# Patient Record
Sex: Female | Born: 1960 | Race: Black or African American | Hispanic: No | Marital: Single | Smoking: Current every day smoker
Health system: Southern US, Community
[De-identification: ages and names within clinical notes are randomized; demographics above are authoritative.]

## PROBLEM LIST (undated history)

## (undated) DIAGNOSIS — R112 Nausea with vomiting, unspecified: Secondary | ICD-10-CM

## (undated) DIAGNOSIS — Z9889 Other specified postprocedural states: Secondary | ICD-10-CM

## (undated) DIAGNOSIS — I2781 Cor pulmonale (chronic): Secondary | ICD-10-CM

## (undated) DIAGNOSIS — D649 Anemia, unspecified: Secondary | ICD-10-CM

## (undated) DIAGNOSIS — Z720 Tobacco use: Secondary | ICD-10-CM

## (undated) DIAGNOSIS — J449 Chronic obstructive pulmonary disease, unspecified: Secondary | ICD-10-CM

## (undated) HISTORY — DX: Anemia, unspecified: D64.9

## (undated) HISTORY — PX: OTHER SURGICAL HISTORY: SHX169

---

## 2001-06-28 ENCOUNTER — Encounter: Payer: Self-pay | Admitting: Emergency Medicine

## 2001-06-28 ENCOUNTER — Emergency Department (HOSPITAL_COMMUNITY): Admission: EM | Admit: 2001-06-28 | Discharge: 2001-06-28 | Payer: Self-pay | Admitting: Emergency Medicine

## 2002-01-07 ENCOUNTER — Emergency Department (HOSPITAL_COMMUNITY): Admission: EM | Admit: 2002-01-07 | Discharge: 2002-01-07 | Payer: Self-pay | Admitting: Emergency Medicine

## 2002-01-12 ENCOUNTER — Emergency Department (HOSPITAL_COMMUNITY): Admission: EM | Admit: 2002-01-12 | Discharge: 2002-01-12 | Payer: Self-pay | Admitting: Emergency Medicine

## 2002-01-12 ENCOUNTER — Encounter: Payer: Self-pay | Admitting: Emergency Medicine

## 2002-01-14 ENCOUNTER — Encounter: Admission: RE | Admit: 2002-01-14 | Discharge: 2002-01-22 | Payer: Self-pay | Admitting: *Deleted

## 2002-01-23 ENCOUNTER — Encounter: Admission: RE | Admit: 2002-01-23 | Discharge: 2002-02-13 | Payer: Self-pay | Admitting: *Deleted

## 2004-07-11 ENCOUNTER — Emergency Department (HOSPITAL_COMMUNITY): Admission: EM | Admit: 2004-07-11 | Discharge: 2004-07-11 | Payer: Self-pay | Admitting: Emergency Medicine

## 2004-11-29 ENCOUNTER — Emergency Department (HOSPITAL_COMMUNITY): Admission: EM | Admit: 2004-11-29 | Discharge: 2004-11-29 | Payer: Self-pay | Admitting: Emergency Medicine

## 2004-12-26 ENCOUNTER — Ambulatory Visit: Payer: Self-pay | Admitting: *Deleted

## 2010-03-28 ENCOUNTER — Emergency Department (HOSPITAL_COMMUNITY): Payer: Self-pay

## 2010-03-28 ENCOUNTER — Emergency Department (HOSPITAL_COMMUNITY)
Admission: EM | Admit: 2010-03-28 | Discharge: 2010-03-28 | Disposition: A | Discharge status: Home / Self Care | Payer: Self-pay | Attending: Emergency Medicine | Admitting: Emergency Medicine

## 2010-03-28 DIAGNOSIS — IMO0002 Reserved for concepts with insufficient information to code with codable children: Secondary | ICD-10-CM | POA: Insufficient documentation

## 2010-03-28 DIAGNOSIS — W010XXA Fall on same level from slipping, tripping and stumbling without subsequent striking against object, initial encounter: Secondary | ICD-10-CM | POA: Insufficient documentation

## 2010-03-28 DIAGNOSIS — M25519 Pain in unspecified shoulder: Secondary | ICD-10-CM | POA: Insufficient documentation

## 2010-03-28 DIAGNOSIS — Y92009 Unspecified place in unspecified non-institutional (private) residence as the place of occurrence of the external cause: Secondary | ICD-10-CM | POA: Insufficient documentation

## 2010-03-28 DIAGNOSIS — M79609 Pain in unspecified limb: Secondary | ICD-10-CM | POA: Insufficient documentation

## 2016-04-24 ENCOUNTER — Emergency Department (HOSPITAL_COMMUNITY)
Admission: EM | Admit: 2016-04-24 | Discharge: 2016-04-24 | Disposition: A | Discharge status: Home / Self Care | Payer: Self-pay | Attending: Emergency Medicine | Admitting: Emergency Medicine

## 2016-04-24 ENCOUNTER — Emergency Department (HOSPITAL_COMMUNITY): Payer: Self-pay

## 2016-04-24 ENCOUNTER — Encounter (HOSPITAL_COMMUNITY): Payer: Self-pay | Admitting: Emergency Medicine

## 2016-04-24 DIAGNOSIS — Y999 Unspecified external cause status: Secondary | ICD-10-CM | POA: Insufficient documentation

## 2016-04-24 DIAGNOSIS — F17210 Nicotine dependence, cigarettes, uncomplicated: Secondary | ICD-10-CM | POA: Insufficient documentation

## 2016-04-24 DIAGNOSIS — Y9389 Activity, other specified: Secondary | ICD-10-CM | POA: Insufficient documentation

## 2016-04-24 DIAGNOSIS — S82851A Displaced trimalleolar fracture of right lower leg, initial encounter for closed fracture: Secondary | ICD-10-CM | POA: Insufficient documentation

## 2016-04-24 DIAGNOSIS — W000XXA Fall on same level due to ice and snow, initial encounter: Secondary | ICD-10-CM | POA: Insufficient documentation

## 2016-04-24 DIAGNOSIS — Y929 Unspecified place or not applicable: Secondary | ICD-10-CM | POA: Insufficient documentation

## 2016-04-24 MED ORDER — PROPOFOL 10 MG/ML IV BOLUS
0.5000 mg/kg | INTRAVENOUS | Status: DC | PRN
  Administered 2016-04-24: 59 mg via INTRAVENOUS
  Filled 2016-04-24: qty 20

## 2016-04-24 MED ORDER — HYDROMORPHONE HCL 1 MG/ML IJ SOLN
1.0000 mg | Freq: Once | INTRAMUSCULAR | Status: AC
  Administered 2016-04-24: 1 mg via INTRAVENOUS
  Filled 2016-04-24: qty 1

## 2016-04-24 MED ORDER — HYDROCODONE-ACETAMINOPHEN 5-325 MG PO TABS
2.0000 | ORAL_TABLET | Freq: Once | ORAL | Status: AC
  Administered 2016-04-24: 2 via ORAL
  Filled 2016-04-24: qty 2

## 2016-04-24 MED ORDER — HYDROCODONE-ACETAMINOPHEN 5-325 MG PO TABS: 1.0000 | ORAL_TABLET | Freq: Four times a day (QID) | ORAL | 0 refills | Status: DC | PRN

## 2016-04-24 NOTE — ED Provider Notes (Signed)
[facility]-EMERGENCY DEPT Provider Note   CSN: [id number] Arrival date & time: 04/24/16  [id number]     History   Chief Complaint Chief Complaint  Patient presents with  . Fall    HPI Stephanie Knight is a 56 y.o. female.  Patient presents to the ED with a chief complaint of right ankle pain.  She states that she slipped and fell on the ice this morning.  She has been unable to ambulate since.  She complains of severe pain.  The pain is worsened with movement and palpation.  She denies any other injuries.  Denies any LOC.  She received 100 mcg Fentanyl from EMS.   The history is provided by the patient. No language interpreter was used.    History reviewed. No pertinent past medical history.  There are no active problems to display for this patient.   History reviewed. No pertinent surgical history.  OB History    No data available       Home Medications    Prior to Admission medications   Not on File    Family History History reviewed. No pertinent family history.  Social History Social History  Substance Use Topics  . Smoking status: Current Every Day Smoker    Packs/day: 0.50    Types: Cigarettes  . Smokeless tobacco: Never Used  . Alcohol use No     Allergies   Patient has no allergy information on record.   Review of Systems Review of Systems  All other systems reviewed and are negative.    Physical Exam Updated Vital Signs BP 129/64 (BP Location: Right Arm)   Pulse 64   Temp 98 F (36.7 C) (Oral)   Resp 18   Ht 5\' 7"  (1.702 m)   Wt 117.9 kg   SpO2 96%   BMI 40.72 kg/m   Physical Exam Nursing note and vitals reviewed.  Constitutional: Pt appears well-developed and well-nourished. No distress.  HENT:  Head: Normocephalic and atraumatic.  Eyes: Conjunctivae are normal.  Neck: Normal range of motion.  Cardiovascular: Normal rate, regular rhythm. Intact distal pulses.   Capillary refill < 3 sec.  Pulmonary/Chest: Effort normal and breath  sounds normal.  Musculoskeletal:  RLE Pt exhibits obvious deformity to right ankle with tenderness to palpation diffusely, appears dislocated vs fracture dislocation.   ROM: deferred due to pain  Strength: deferred due to pain  Neurological: Pt  is alert. Coordination normal.  Sensation: 5/5 Skin: Skin is warm and dry. Pt is not diaphoretic.  No evidence of open wound or skin tenting Psychiatric: Pt has a normal mood and affect.     ED Treatments / Results  Labs (all labs ordered are listed, but only abnormal results are displayed) Labs Reviewed - No data to display  EKG  EKG Interpretation None       Radiology Dg Ankle Complete Right  Result Date: 04/24/2016 CLINICAL DATA:  Postreduction or ankle dislocation EXAM: RIGHT ANKLE - COMPLETE 3+ VIEW COMPARISON:  Preoperative study obtained earlier in the day FINDINGS: Frontal and lateral views were obtained. Overlying immobilization device. The previous ankle mortise disruption has been reduced. Comminuted fracture of the distal fibular diaphysis remains with several mildly displaced fragments. Comminuted fracture of the medial malleolus present with fracture fragments in near anatomic alignment. Fracture of the distal tibia noted with fracture fragments in near anatomic alignment. IMPRESSION: Reduction of mortise dislocation with trimalleolar fractures present. Fracture fragments overall in near anatomic alignment. Electronically Signed   By: Chrissie NoaWilliam  Margarita Grizzle III M.D.   On: 04/24/2016 11:02   Dg Ankle Complete Right  Result Date: 04/24/2016 CLINICAL DATA:  Right ankle injury and pain due to a slip on the ice this morning. Initial encounter. EXAM: RIGHT ANKLE - COMPLETE 3+ VIEW COMPARISON:  None. FINDINGS: The tibiotalar joint is posteriorly dislocated. Oblique fracture of the distal fibula demonstrates 1 shaft width posterior displacement of the distal fragment. Small fracture of the posterior malleolus is distracted 2 cm from the  remainder of the tibia. Minimally displaced medial malleolar fracture is also seen. Soft tissue swelling is present about the ankle. IMPRESSION: Trimalleolar fracture dislocation right ankle as described. Electronically Signed   By: Drusilla Kanner M.D.   On: 04/24/2016 10:04    Procedures Reduction of dislocation Date/Time: 04/24/2016 2:19 PM Performed by: Roxy Horseman Authorized by: Roxy Horseman  Consent: Verbal consent obtained. Risks and benefits: risks, benefits and alternatives were discussed Consent given by: patient Patient understanding: patient states understanding of the procedure being performed Patient consent: the patient's understanding of the procedure matches consent given Procedure consent: procedure consent matches procedure scheduled Relevant documents: relevant documents present and verified Test results: test results available and properly labeled Site marked: the operative site was marked Imaging studies: imaging studies available Required items: required blood products, implants, devices, and special equipment available Patient identity confirmed: verbally with patient Time out: Immediately prior to procedure a "time out" was called to verify the correct patient, procedure, equipment, support staff and site/side marked as required. Local anesthesia used: no  Anesthesia: Local anesthesia used: no  Sedation: Patient sedated: yes (See Dr. Delford Field note) Patient tolerance: Patient tolerated the procedure well with no immediate complications    (including critical care time)  SPLINT APPLICATION Date/Time: 2:21 PM Authorized by: Roxy Horseman Consent: Verbal consent obtained. Risks and benefits: risks, benefits and alternatives were discussed Consent given by: patient Splint applied by: orthopedic technician Location details: right ankle Splint type: Posterior and strirrup Supplies used: fiberglass and ace Post-procedure: The splinted body part  was neurovascularly unchanged following the procedure. Patient tolerance: Patient tolerated the procedure well with no immediate complications.    Medications Ordered in ED Medications  HYDROmorphone (DILAUDID) injection 1 mg (not administered)     Initial Impression / Assessment and Plan / ED Course  I have reviewed the triage vital signs and the nursing notes.  Pertinent labs & imaging results that were available during my care of the patient were reviewed by me and considered in my medical decision making (see chart for details).     Patient with trimalleolar fracture as above.  Reduced in ED.  Discussed with Dr. Eulah Pont, who recommends follow-up in his office tomorrow at 8:30 and to get a CT here.  Pt advised to follow up with orthopedics. Patient given splint and crutches while in ED, conservative therapy recommended and discussed. Patient will be discharged home & is agreeable with above plan. Returns precautions discussed. Pt appears safe for discharge.   Final Clinical Impressions(s) / ED Diagnoses   Final diagnoses:  Closed trimalleolar fracture of right ankle, initial encounter    New Prescriptions New Prescriptions   HYDROCODONE-ACETAMINOPHEN (NORCO/VICODIN) 5-325 MG TABLET    Take 1-2 tablets by mouth every 6 (six) hours as needed.     Roxy Horseman, PA-C 04/24/16 1422    Linwood Dibbles, MD 04/26/16 1504    Linwood Dibbles, MD 04/26/16 [phone]

## 2016-04-24 NOTE — ED Triage Notes (Signed)
Per EMS patient fell outside on her steps this morning.  No LOC, obvious deformity to right ankle.  Patient received 100 fentanyl via EMS en route to hospital.

## 2016-04-24 NOTE — ED Notes (Signed)
Patient transported to CT 

## 2016-04-24 NOTE — ED Notes (Signed)
Bed: [id number] Expected date:  Expected time:  Means of arrival:  Comments: EMS-FX ankle

## 2016-04-24 NOTE — ED Notes (Signed)
Called Ortho Tech to inform of crutches order.

## 2016-04-24 NOTE — ED Provider Notes (Signed)
Medical screening examination/treatment/procedure(s) were conducted as a shared visit with non-physician practitioner(s) and myself.  I personally evaluated the patient during the encounter.  .Sedation Date/Time: 04/24/2016 9:00 AM Performed by: Linwood DibblesKNAPP, Deshea Pooley Authorized by: Linwood DibblesKNAPP, Caedon Bond   Consent:    Consent obtained:  Verbal   Consent given by:  Patient   Risks discussed:  Allergic reaction, dysrhythmia, inadequate sedation, nausea, vomiting, respiratory compromise necessitating ventilatory assistance and intubation, prolonged sedation necessitating reversal and prolonged hypoxia resulting in organ damage   Alternatives discussed:  Analgesia without sedation Indications:    Procedure performed:  Dislocation reduction   Procedure necessitating sedation performed by:  Physician performing sedation   Intended level of sedation:  Deep Pre-sedation assessment:    Time since last food or drink:  12 hours   ASA classification: class 1 - normal, healthy patient     Neck mobility: normal     Mouth opening:  3 or more finger widths   Thyromental distance:  3 finger widths   Mallampati score:  II - soft palate, uvula, fauces visible   Pre-sedation assessments completed and reviewed: airway patency, cardiovascular function, hydration status, mental status, nausea/vomiting, pain level, respiratory function and temperature     History of difficult intubation: no     Pre-sedation assessment completed:  04/24/2016 9:02 AM Immediate pre-procedure details:    Reassessment: Patient reassessed immediately prior to procedure     Reviewed: vital signs and NPO status     Verified: bag valve mask available, emergency equipment available, intubation equipment available, IV patency confirmed, oxygen available, reversal medications available and suction available   Procedure details (see MAR for exact dosages):    Sedation start time:  04/24/2016 10:34 AM   Preoxygenation:  Nasal cannula   Sedation:  Propofol  Intra-procedure monitoring:  Blood pressure monitoring, cardiac monitor, continuous pulse oximetry, frequent LOC assessments, frequent vital sign checks and continuous capnometry   Intra-procedure events: none     Sedation end time:  04/24/2016 10:49 AM Post-procedure details:    Post-sedation assessment completed:  04/24/2016 10:49 AM   Attendance: Constant attendance by certified staff until patient recovered     Recovery: Patient returned to pre-procedure baseline     Estimated blood loss (see I/O flowsheets): no     Post-sedation assessments completed and reviewed: airway patency, cardiovascular function, hydration status, mental status, nausea/vomiting, pain level and temperature     Specimens recovered:  None   Patient tolerance:  Tolerated well, no immediate complications       Linwood DibblesJon Shavana Calder, MD 04/24/16 1049

## 2016-04-27 ENCOUNTER — Encounter (HOSPITAL_BASED_OUTPATIENT_CLINIC_OR_DEPARTMENT_OTHER): Payer: Self-pay | Admitting: *Deleted

## 2016-04-27 NOTE — H&P (Signed)
  [facility] [address]   [address] [place], [place] [id number] [phone] A Division of [facility]  RE: Stephanie JaegerMCNAIR, Stephanie   [phone]   02-23-2060  04-25-16 Reason for visit: Referral from the emergency room with a right ankle fracture dislocation that was reduced there on April 24, 2016.  She slipped on the ice.   History of present illness: Her pain is well controlled.    EXAMINATION: Well appearing female in no apparent distress.  Splint is benign.  She still has a fair amount of swelling.  She is neurovascularly intact.    X-RAYS: X-rays from the emergency room confirm appropriate reduction of this fracture.  I did obtain a CT, it is really more of a pilon pattern with significant joint space involvement.    ASSESSMENT/PLAN: Trimalleolar mechanism with a pilon type fracture.  I discussed the risks and benefits of ORIF of this.  She would like to go forward with that.  She will follow up afterwards.  She will be non-weight bearing for six weeks.    Jewel Baizeimothy D.  Eulah PontMurphy, M.D.  Electronically verified by Jewel Baizeimothy D. Eulah PontMurphy, M.D. TDM:jjh D 04-26-16 T 04-27-16

## 2016-05-02 NOTE — Progress Notes (Signed)
Spoke with Aquilla HackerHenry Martensen III PA on Friday and did not want MRSA screening- said it was in his order set.

## 2016-05-03 ENCOUNTER — Ambulatory Visit (HOSPITAL_BASED_OUTPATIENT_CLINIC_OR_DEPARTMENT_OTHER): Payer: Self-pay | Admitting: Anesthesiology

## 2016-05-03 ENCOUNTER — Encounter (HOSPITAL_BASED_OUTPATIENT_CLINIC_OR_DEPARTMENT_OTHER)
Admission: RE | Disposition: A | Discharge status: Home / Self Care | Payer: Self-pay | Source: Ambulatory Visit | Attending: Orthopedic Surgery

## 2016-05-03 ENCOUNTER — Ambulatory Visit (HOSPITAL_BASED_OUTPATIENT_CLINIC_OR_DEPARTMENT_OTHER)
Admission: RE | Admit: 2016-05-03 | Discharge: 2016-05-03 | Disposition: A | Discharge status: Home / Self Care | Payer: Self-pay | Source: Ambulatory Visit | Attending: Orthopedic Surgery | Admitting: Orthopedic Surgery

## 2016-05-03 ENCOUNTER — Encounter (HOSPITAL_BASED_OUTPATIENT_CLINIC_OR_DEPARTMENT_OTHER): Payer: Self-pay | Admitting: Anesthesiology

## 2016-05-03 DIAGNOSIS — F17200 Nicotine dependence, unspecified, uncomplicated: Secondary | ICD-10-CM | POA: Insufficient documentation

## 2016-05-03 DIAGNOSIS — W000XXA Fall on same level due to ice and snow, initial encounter: Secondary | ICD-10-CM | POA: Insufficient documentation

## 2016-05-03 DIAGNOSIS — Z6841 Body Mass Index (BMI) 40.0 and over, adult: Secondary | ICD-10-CM | POA: Insufficient documentation

## 2016-05-03 DIAGNOSIS — S82871A Displaced pilon fracture of right tibia, initial encounter for closed fracture: Secondary | ICD-10-CM | POA: Insufficient documentation

## 2016-05-03 DIAGNOSIS — S82891A Other fracture of right lower leg, initial encounter for closed fracture: Secondary | ICD-10-CM

## 2016-05-03 HISTORY — PX: ORIF ANKLE FRACTURE: SHX5408

## 2016-05-03 HISTORY — DX: Other specified postprocedural states: R11.2

## 2016-05-03 HISTORY — DX: Other specified postprocedural states: Z98.890

## 2016-05-03 HISTORY — DX: Nausea with vomiting, unspecified: R11.2

## 2016-05-03 SURGERY — OPEN REDUCTION INTERNAL FIXATION (ORIF) ANKLE FRACTURE
Anesthesia: Regional | Laterality: Right

## 2016-05-03 MED ORDER — LIDOCAINE 2% (20 MG/ML) 5 ML SYRINGE
INTRAMUSCULAR | Status: AC
  Filled 2016-05-03: qty 5

## 2016-05-03 MED ORDER — SCOPOLAMINE 1 MG/3DAYS TD PT72
MEDICATED_PATCH | TRANSDERMAL | Status: AC
  Filled 2016-05-03: qty 1

## 2016-05-03 MED ORDER — PROPOFOL 10 MG/ML IV BOLUS
INTRAVENOUS | Status: AC
  Filled 2016-05-03: qty 20

## 2016-05-03 MED ORDER — LACTATED RINGERS IV SOLN
INTRAVENOUS | Status: DC
  Administered 2016-05-03: 09:00:00 via INTRAVENOUS

## 2016-05-03 MED ORDER — ONDANSETRON HCL 4 MG/2ML IJ SOLN
INTRAMUSCULAR | Status: AC
  Filled 2016-05-03: qty 2

## 2016-05-03 MED ORDER — SCOPOLAMINE 1 MG/3DAYS TD PT72: 1.0000 | MEDICATED_PATCH | Freq: Once | TRANSDERMAL | Status: DC | PRN

## 2016-05-03 MED ORDER — PHENYLEPHRINE 40 MCG/ML (10ML) SYRINGE FOR IV PUSH (FOR BLOOD PRESSURE SUPPORT)
PREFILLED_SYRINGE | INTRAVENOUS | Status: AC
  Filled 2016-05-03: qty 10

## 2016-05-03 MED ORDER — ASPIRIN EC 81 MG PO TBEC: 81.0000 mg | DELAYED_RELEASE_TABLET | Freq: Every day | ORAL | 0 refills | Status: DC

## 2016-05-03 MED ORDER — ACETAMINOPHEN 500 MG PO TABS
1000.0000 mg | ORAL_TABLET | Freq: Once | ORAL | Status: AC
  Administered 2016-05-03: 1000 mg via ORAL

## 2016-05-03 MED ORDER — ACETAMINOPHEN 500 MG PO TABS
ORAL_TABLET | ORAL | Status: AC
  Filled 2016-05-03: qty 2

## 2016-05-03 MED ORDER — LACTATED RINGERS IV SOLN: INTRAVENOUS | Status: DC

## 2016-05-03 MED ORDER — METHOCARBAMOL 500 MG PO TABS: 500.0000 mg | ORAL_TABLET | Freq: Four times a day (QID) | ORAL | 0 refills | Status: DC | PRN

## 2016-05-03 MED ORDER — PROPOFOL 10 MG/ML IV BOLUS
INTRAVENOUS | Status: DC | PRN
  Administered 2016-05-03: 200 mg via INTRAVENOUS

## 2016-05-03 MED ORDER — CEFAZOLIN SODIUM-DEXTROSE 2-4 GM/100ML-% IV SOLN
INTRAVENOUS | Status: AC
  Filled 2016-05-03: qty 100

## 2016-05-03 MED ORDER — ONDANSETRON HCL 4 MG PO TABS: 4.0000 mg | ORAL_TABLET | Freq: Three times a day (TID) | ORAL | 0 refills | Status: DC | PRN

## 2016-05-03 MED ORDER — PROMETHAZINE HCL 25 MG/ML IJ SOLN: 6.2500 mg | INTRAMUSCULAR | Status: DC | PRN

## 2016-05-03 MED ORDER — CHLORHEXIDINE GLUCONATE 4 % EX LIQD: 60.0000 mL | Freq: Once | CUTANEOUS | Status: DC

## 2016-05-03 MED ORDER — HYDROMORPHONE HCL 1 MG/ML IJ SOLN: 0.2500 mg | INTRAMUSCULAR | Status: DC | PRN

## 2016-05-03 MED ORDER — OXYCODONE HCL 5 MG PO TABS: 5.0000 mg | ORAL_TABLET | Freq: Once | ORAL | Status: DC | PRN

## 2016-05-03 MED ORDER — MEPERIDINE HCL 25 MG/ML IJ SOLN: 6.2500 mg | INTRAMUSCULAR | Status: DC | PRN

## 2016-05-03 MED ORDER — FENTANYL CITRATE (PF) 100 MCG/2ML IJ SOLN
INTRAMUSCULAR | Status: AC
  Filled 2016-05-03: qty 2

## 2016-05-03 MED ORDER — OXYCODONE HCL 5 MG/5ML PO SOLN: 5.0000 mg | Freq: Once | ORAL | Status: DC | PRN

## 2016-05-03 MED ORDER — SCOPOLAMINE 1 MG/3DAYS TD PT72
1.0000 | MEDICATED_PATCH | Freq: Once | TRANSDERMAL | Status: DC
  Administered 2016-05-03: 1.5 mg via TRANSDERMAL

## 2016-05-03 MED ORDER — DOCUSATE SODIUM 100 MG PO CAPS: 100.0000 mg | ORAL_CAPSULE | Freq: Two times a day (BID) | ORAL | 0 refills | Status: DC

## 2016-05-03 MED ORDER — FENTANYL CITRATE (PF) 100 MCG/2ML IJ SOLN
50.0000 ug | INTRAMUSCULAR | Status: DC | PRN
  Administered 2016-05-03: 100 ug via INTRAVENOUS

## 2016-05-03 MED ORDER — MIDAZOLAM HCL 2 MG/2ML IJ SOLN
1.0000 mg | INTRAMUSCULAR | Status: DC | PRN
  Administered 2016-05-03: 2 mg via INTRAVENOUS

## 2016-05-03 MED ORDER — OXYCODONE-ACETAMINOPHEN 5-325 MG PO TABS: 1.0000 | ORAL_TABLET | ORAL | 0 refills | Status: DC | PRN

## 2016-05-03 MED ORDER — DEXAMETHASONE SODIUM PHOSPHATE 10 MG/ML IJ SOLN
INTRAMUSCULAR | Status: AC
  Filled 2016-05-03: qty 1

## 2016-05-03 MED ORDER — ONDANSETRON HCL 4 MG/2ML IJ SOLN
INTRAMUSCULAR | Status: DC | PRN
  Administered 2016-05-03: 4 mg via INTRAVENOUS

## 2016-05-03 MED ORDER — MIDAZOLAM HCL 2 MG/2ML IJ SOLN
INTRAMUSCULAR | Status: AC
  Filled 2016-05-03: qty 2

## 2016-05-03 MED ORDER — CEFAZOLIN SODIUM-DEXTROSE 2-4 GM/100ML-% IV SOLN
2.0000 g | INTRAVENOUS | Status: AC
  Administered 2016-05-03: 2 g via INTRAVENOUS

## 2016-05-03 MED ORDER — CEFAZOLIN IN D5W 1 GM/50ML IV SOLN
INTRAVENOUS | Status: AC
  Filled 2016-05-03: qty 50

## 2016-05-03 MED ORDER — LIDOCAINE 2% (20 MG/ML) 5 ML SYRINGE
INTRAMUSCULAR | Status: DC | PRN
  Administered 2016-05-03: 80 mg via INTRAVENOUS

## 2016-05-03 SURGICAL SUPPLY — 78 items
BANDAGE ACE 4X5 VEL STRL LF (GAUZE/BANDAGES/DRESSINGS) ×3 IMPLANT
BANDAGE ACE 6X5 VEL STRL LF (GAUZE/BANDAGES/DRESSINGS) ×3 IMPLANT
BANDAGE ESMARK 6X9 LF (GAUZE/BANDAGES/DRESSINGS) ×1 IMPLANT
BIT DRILL 2.5X125 (BIT) ×2 IMPLANT
BIT DRILL 3.5X125 (BIT) IMPLANT
BIT DRILL CANN 2.7 (BIT) ×2
BIT DRILL CANN 2.7MM (BIT) ×1
BIT DRILL SRG 2.7XCANN AO CPLG (BIT) IMPLANT
BIT DRL SRG 2.7XCANN AO CPLNG (BIT) ×1
BLADE SURG 15 STRL LF DISP TIS (BLADE) ×2 IMPLANT
BLADE SURG 15 STRL SS (BLADE) ×6
BNDG CMPR 9X6 STRL LF SNTH (GAUZE/BANDAGES/DRESSINGS) ×1
BNDG COHESIVE 4X5 TAN STRL (GAUZE/BANDAGES/DRESSINGS) ×3 IMPLANT
BNDG ESMARK 6X9 LF (GAUZE/BANDAGES/DRESSINGS) ×3
CHLORAPREP W/TINT 26ML (MISCELLANEOUS) ×3 IMPLANT
CLOSURE STERI-STRIP 1/2X4 (GAUZE/BANDAGES/DRESSINGS) ×1
CLSR STERI-STRIP ANTIMIC 1/2X4 (GAUZE/BANDAGES/DRESSINGS) ×2 IMPLANT
COVER BACK TABLE 60X90IN (DRAPES) ×3 IMPLANT
CUFF TOURNIQUET SINGLE 24IN (TOURNIQUET CUFF) IMPLANT
CUFF TOURNIQUET SINGLE 34IN LL (TOURNIQUET CUFF) ×2 IMPLANT
DECANTER SPIKE VIAL GLASS SM (MISCELLANEOUS) IMPLANT
DRAPE EXTREMITY T 121X128X90 (DRAPE) ×3 IMPLANT
DRAPE IMP U-DRAPE 54X76 (DRAPES) ×3 IMPLANT
DRAPE OEC MINIVIEW 54X84 (DRAPES) ×3 IMPLANT
DRAPE U-SHAPE 47X51 STRL (DRAPES) ×3 IMPLANT
DRILL BIT 3.5X125 (BIT) ×3
DRSG EMULSION OIL 3X3 NADH (GAUZE/BANDAGES/DRESSINGS) ×3 IMPLANT
DRSG PAD ABDOMINAL 8X10 ST (GAUZE/BANDAGES/DRESSINGS) ×3 IMPLANT
ELECT REM PT RETURN 9FT ADLT (ELECTROSURGICAL) ×3
ELECTRODE REM PT RTRN 9FT ADLT (ELECTROSURGICAL) ×1 IMPLANT
GAUZE SPONGE 4X4 12PLY STRL (GAUZE/BANDAGES/DRESSINGS) ×3 IMPLANT
GLOVE BIO SURGEON STRL SZ7.5 (GLOVE) ×6 IMPLANT
GLOVE BIOGEL PI IND STRL 8 (GLOVE) ×2 IMPLANT
GLOVE BIOGEL PI INDICATOR 8 (GLOVE) ×4
GOWN STRL REUS W/ TWL LRG LVL3 (GOWN DISPOSABLE) ×2 IMPLANT
GOWN STRL REUS W/ TWL XL LVL3 (GOWN DISPOSABLE) ×1 IMPLANT
GOWN STRL REUS W/TWL LRG LVL3 (GOWN DISPOSABLE) ×6
GOWN STRL REUS W/TWL XL LVL3 (GOWN DISPOSABLE) ×3
K-WIRE ORTHOPEDIC 1.4X150L (WIRE) ×6
KWIRE ORTHOPEDIC 1.4X150L (WIRE) IMPLANT
NEEDLE HYPO 22GX1.5 SAFETY (NEEDLE) IMPLANT
NS IRRIG 1000ML POUR BTL (IV SOLUTION) ×3 IMPLANT
PACK BASIN DAY SURGERY FS (CUSTOM PROCEDURE TRAY) ×3 IMPLANT
PAD CAST 4YDX4 CTTN HI CHSV (CAST SUPPLIES) ×1 IMPLANT
PADDING CAST ABS 4INX4YD NS (CAST SUPPLIES) ×4
PADDING CAST ABS COTTON 4X4 ST (CAST SUPPLIES) ×2 IMPLANT
PADDING CAST COTTON 4X4 STRL (CAST SUPPLIES) ×3
PADDING CAST COTTON 6X4 STRL (CAST SUPPLIES) ×3 IMPLANT
PENCIL BUTTON HOLSTER BLD 10FT (ELECTRODE) ×3 IMPLANT
PLATE 1/3 TUBULAR 7H (Plate) ×2 IMPLANT
SCREW CANC 2.5XFT HEX12X4X (Screw) IMPLANT
SCREW CANCELLOUS 4.0X12MM (Screw) ×9 IMPLANT
SCREW CANN 4.0X42MM (Screw) ×2 IMPLANT
SCREW CANN 44X15X4XSLF DRL (Screw) IMPLANT
SCREW CANNULATED 4.0X44MM (Screw) ×3 IMPLANT
SCREW CORTEX ST MATTA 3.5X12MM (Screw) ×6 IMPLANT
SCREW CORTEX ST MATTA 3.5X20 (Screw) ×2 IMPLANT
SLEEVE SCD COMPRESS KNEE MED (MISCELLANEOUS) ×2 IMPLANT
SPLINT FAST PLASTER 5X30 (CAST SUPPLIES) ×40
SPLINT PLASTER CAST FAST 5X30 (CAST SUPPLIES) ×20 IMPLANT
SPONGE LAP 4X18 X RAY DECT (DISPOSABLE) ×3 IMPLANT
SUCTION FRAZIER HANDLE 10FR (MISCELLANEOUS) ×2
SUCTION TUBE FRAZIER 10FR DISP (MISCELLANEOUS) ×1 IMPLANT
SUT ETHILON 3 0 PS 1 (SUTURE) ×4 IMPLANT
SUT MNCRL AB 4-0 PS2 18 (SUTURE) IMPLANT
SUT MON AB 2-0 CT1 36 (SUTURE) IMPLANT
SUT MON AB 3-0 SH 27 (SUTURE)
SUT MON AB 3-0 SH27 (SUTURE) IMPLANT
SUT VIC AB 0 SH 27 (SUTURE) ×3 IMPLANT
SUT VIC AB 2-0 SH 27 (SUTURE)
SUT VIC AB 2-0 SH 27XBRD (SUTURE) IMPLANT
SYR BULB 3OZ (MISCELLANEOUS) ×3 IMPLANT
SYR CONTROL 10ML LL (SYRINGE) IMPLANT
TOWEL OR 17X24 6PK STRL BLUE (TOWEL DISPOSABLE) ×6 IMPLANT
TOWEL OR NON WOVEN STRL DISP B (DISPOSABLE) ×3 IMPLANT
TUBE CONNECTING 20'X1/4 (TUBING) ×1
TUBE CONNECTING 20X1/4 (TUBING) ×2 IMPLANT
UNDERPAD 30X30 (UNDERPADS AND DIAPERS) ×3 IMPLANT

## 2016-05-03 NOTE — Transfer of Care (Signed)
Immediate Anesthesia Transfer of Care Note  Patient: Stephanie Knight  Procedure(s) Performed: Procedure(s): OPEN REDUCTION INTERNAL FIXATION RIGHT (ORIF) ANKLE FRACTURE WEIGHT BEARING DISTAL TIBIA WITH FIXATION TIBIA AND FIBULA (Right)  Patient Location: PACU  Anesthesia Type:General  Level of Consciousness: awake  Airway & Oxygen Therapy: Patient Spontanous Breathing and Patient connected to face mask oxygen  Post-op Assessment: Report given to RN and Post -op Vital signs reviewed and stable  Post vital signs: Reviewed and stable  Last Vitals:  Vitals:   05/03/16 0930 05/03/16 1101  BP:  122/87  Pulse: (!) 55 69  Resp: 13 16  Temp:      Last Pain:  Vitals:   05/03/16 0846  TempSrc: Oral  PainSc: 0-No pain         Complications: No apparent anesthesia complications

## 2016-05-03 NOTE — Anesthesia Procedure Notes (Signed)
Anesthesia Regional Block: Popliteal block   Pre-Anesthetic Checklist: ,, timeout performed, Correct Patient, Correct Site, Correct Laterality, Correct Procedure, Correct Position, site marked, Risks and benefits discussed,  Surgical consent,  Pre-op evaluation,  At surgeon's request and post-op pain management  Laterality: Right  Prep: chloraprep       Needles:  Injection technique: Single-shot  Needle Type: Stimiplex     Needle Length: 9cm  Needle Gauge: 21     Additional Needles:   Procedures: ultrasound guided,,,,,,,,  Narrative:  Start time: 05/03/2016 9:00 AM End time: 05/03/2016 9:05 AM Injection made incrementally with aspirations every 5 mL.  Performed by: Personally  Anesthesiologist: Anitra LauthMILLER, WARREN RAY  Additional Notes: 30 ml 0.5% Ropivacaine

## 2016-05-03 NOTE — Anesthesia Procedure Notes (Signed)
Procedure Name: LMA Insertion Date/Time: 05/03/2016 9:37 AM Performed by: Caren MacadamARTER, Ranita Stjulien W Pre-anesthesia Checklist: Patient identified, Emergency Drugs available, Suction available and Patient being monitored Patient Re-evaluated:Patient Re-evaluated prior to inductionOxygen Delivery Method: Circle system utilized Preoxygenation: Pre-oxygenation with 100% oxygen Intubation Type: IV induction Ventilation: Mask ventilation without difficulty LMA: LMA inserted LMA Size: 4.0 Number of attempts: 1 Airway Equipment and Method: Bite block Placement Confirmation: positive ETCO2 and breath sounds checked- equal and bilateral Tube secured with: Tape Dental Injury: Teeth and Oropharynx as per pre-operative assessment

## 2016-05-03 NOTE — Anesthesia Preprocedure Evaluation (Addendum)
Anesthesia Evaluation  Patient identified by MRN, date of birth, ID band Patient awake    Reviewed: Allergy & Precautions, NPO status , Patient's Chart, lab work & pertinent test results  History of Anesthesia Complications (+) PONV and history of anesthetic complications  Airway Mallampati: II  TM Distance: >3 FB Neck ROM: Full    Dental no notable dental hx. (+) Partial Upper, Partial Lower, Poor Dentition, Chipped, Missing, Loose, Dental Advisory Given   Pulmonary neg pulmonary ROS, Current Smoker,    Pulmonary exam normal breath sounds clear to auscultation       Cardiovascular negative cardio ROS Normal cardiovascular exam Rhythm:Regular Rate:Normal     Neuro/Psych negative neurological ROS  negative psych ROS   GI/Hepatic negative GI ROS, Neg liver ROS,   Endo/Other  negative endocrine ROSMorbid obesity  Renal/GU negative Renal ROS  negative genitourinary   Musculoskeletal negative musculoskeletal ROS (+)   Abdominal (+) + obese,   Peds negative pediatric ROS (+)  Hematology negative hematology ROS (+)   Anesthesia Other Findings   Reproductive/Obstetrics negative OB ROS                            Anesthesia Physical Anesthesia Plan  ASA: III  Anesthesia Plan: General and Regional   Post-op Pain Management: GA combined w/ Regional for post-op pain   Induction: Intravenous  Airway Management Planned: LMA  Additional Equipment:   Intra-op Plan:   Post-operative Plan: Extubation in OR  Informed Consent: I have reviewed the patients History and Physical, chart, labs and discussed the procedure including the risks, benefits and alternatives for the proposed anesthesia with the patient or authorized representative who has indicated his/her understanding and acceptance.   Dental advisory given  Plan Discussed with:   Anesthesia Plan Comments:         Anesthesia  Quick Evaluation

## 2016-05-03 NOTE — Discharge Instructions (Signed)
Elevate leg as frequently as possible.    You may loosen ace wrap and re-apply if it feels too tight.  Diet: As you were doing prior to hospitalization   Shower:  You have a splint on, leave the splint in place and keep the splint dry with a plastic bag.  Dressing:  You have a splint, then just leave the splint in place and we will change your bandages during your first follow-up appointment.    Activity:  Increase activity slowly as tolerated, but follow the weight bearing instructions below.  The rules on driving is that you can not be taking narcotics while you drive, and you must feel in control of the vehicle.    Weight Bearing:  Non weight bearing right leg.  To prevent constipation: you may use a stool softener such as -  Colace (over the counter) 100 mg by mouth twice a day  Drink plenty of fluids (prune juice may be helpful) and high fiber foods Miralax (over the counter) for constipation as needed.    Itching:  If you experience itching with your medications, try taking only a single pain pill, or even half a pain pill at a time.  You may take up to 10 pain pills per day, and you can also use benadryl over the counter for itching or also to help with sleep.   Precautions:  If you experience chest pain or shortness of breath - call 911 immediately for transfer to the hospital emergency department!!  If you develop a fever greater that 101 F, purulent drainage from wound, increased redness or drainage from wound, or calf pain -- Call the office at [phone]                                                Follow- Up Appointment:  Please call for an appointment to be seen in 2 weeks [place] - [phone]    Regional Anesthesia Blocks  1. Numbness or the inability to move the "blocked" extremity may last from 3-48 hours after placement. The length of time depends on the medication injected and your individual response to the medication. If the numbness is not going away  after 48 hours, call your surgeon.  2. The extremity that is blocked will need to be protected until the numbness is gone and the  Strength has returned. Because you cannot feel it, you will need to take extra care to avoid injury. Because it may be weak, you may have difficulty moving it or using it. You may not know what position it is in without looking at it while the block is in effect.  3. For blocks in the legs and feet, returning to weight bearing and walking needs to be done carefully. You will need to wait until the numbness is entirely gone and the strength has returned. You should be able to move your leg and foot normally before you try and bear weight or walk. You will need someone to be with you when you first try to ensure you do not fall and possibly risk injury.  4. Bruising and tenderness at the needle site are common side effects and will resolve in a few days.  5. Persistent numbness or new problems with movement should be communicated to the surgeon or the [facility] [phone] [facility] [phone]).      Post Anesthesia Home Care Instructions  Activity: Get plenty of rest for the remainder of the day. A responsible individual must stay with you for 24 hours following the procedure.  For the next 24 hours, DO NOT: -Drive a car -Advertising copywriterperate machinery -Drink alcoholic beverages -Take any medication unless instructed by your physician -Make any legal decisions or sign important papers.  Meals: Start with liquid foods such as gelatin or soup. Progress to regular foods as tolerated. Avoid greasy, spicy, heavy foods. If nausea and/or vomiting occur, drink only clear liquids until the nausea and/or vomiting subsides. Call your physician if vomiting continues.  Special Instructions/Symptoms: Your throat may feel dry or sore from the anesthesia or the breathing tube placed in your throat during surgery. If this causes discomfort, gargle with  warm salt water. The discomfort should disappear within 24 hours.  If you had a scopolamine patch placed behind your ear for the management of post- operative nausea and/or vomiting:  1. The medication in the patch is effective for 72 hours, after which it should be removed.  Wrap patch in a tissue and discard in the trash. Wash hands thoroughly with soap and water. 2. You may remove the patch earlier than 72 hours if you experience unpleasant side effects which may include dry mouth, dizziness or visual disturbances. 3. Avoid touching the patch. Wash your hands with soap and water after contact with the patch.

## 2016-05-03 NOTE — Op Note (Signed)
05/03/2016    10:30 AM  PATIENT:  Stephanie Knight    PRE-OPERATIVE DIAGNOSIS:  RIGHT ANKLE FRACTURE  POST-OPERATIVE DIAGNOSIS:  Same  PROCEDURE:  OPEN REDUCTION INTERNAL FIXATION RIGHT (ORIF) ANKLE FRACTURE WEIGHT BEARING DISTAL TIBIA WITH FIXATION TIBIA AND FIBULA  SURGEON:  MURPHY, Jewel BaizeIMOTHY D, MD  ASSISTANT: Aquilla HackerHenry Martensen, PA-C, he was present and scrubbed throughout the case, critical for completion in a timely fashion, and for retraction, instrumentation, and closure.   ANESTHESIA:   gen  PREOPERATIVE INDICATIONS:  Stephanie Knight is a  56 y.o. female with a diagnosis of RIGHT ANKLE pilon fracture who failed conservative measures and elected for surgical management.    The risks benefits and alternatives were discussed with the patient preoperatively including but not limited to the risks of infection, bleeding, nerve injury, cardiopulmonary complications, the need for revision surgery, among others, and the patient was willing to proceed.  OPERATIVE IMPLANTS: Lateral fibular plate and canulated pilon fixation  OPERATIVE FINDINGS: Unstable ankle fracture. Stable syndesmosis post op  BLOOD LOSS: min  COMPLICATIONS: none  TOURNIQUET TIME: 60min  OPERATIVE PROCEDURE:  Patient was identified in the preoperative holding area and site was marked by me He was transported to the operating theater and placed on the table in supine position taking care to pad all bony prominences. After a preincinduction time out anesthesia was induced. The right  lower extremity was prepped and draped in normal sterile fashion and a pre-incision timeout was performed. Stephanie Knight received ancef for preoperative antibiotics.   I made a lateral incision of roughly 7 cm dissection was carried down sharply to the distal fibula and then spreading dissection was used proximally to protect the superficial peroneal nerve. I sharply incised the periosteum and took care to protect the peroneal  tendons. I then debrided the fracture site and performed a reduction maneuver which was held in place with a clamp.   I placed a lag screw across the fracture  I then selected a 7-hole one third tubular plate and placed in a neutralization fashion care was taken distally so as not to penetrate the joint with the cancellus screws.  I then made a medial approach to her medial Mal she had a coronal split fracture here which I was able to open and assess her articular surface.  I performed an open reduction of her articular surface I did remove 1 loose fragment that was too small for fixation. I then placed internal fixation in the form of front to back partially threaded screws across this coronal split.  I assessed the stability of her ankle. No longer sagged posteriorly or into the peon portion of the fracture.  I took multiple x-rays as happy with the reduction and placement of all hardware.  I then stressed the syndesmosis and it was stable  I then thoroughly irrigated her incisions I closed her skin incisions placed her in a sterile dressing and a short-leg splint she was awoken and taken the PACU in stable condition    POST OPERATIVE PLAN: Non-weightbearing. DVT prophylaxis will consist of chemical px

## 2016-05-03 NOTE — Progress Notes (Signed)
Assisted Dr. Miller with right, ultrasound guided, popliteal, adductor canal block. Side rails up, monitors on throughout procedure. See vital signs in flow sheet. Tolerated Procedure well. 

## 2016-05-03 NOTE — Anesthesia Procedure Notes (Signed)
Anesthesia Regional Block: Adductor canal block   Pre-Anesthetic Checklist: ,, timeout performed, Correct Patient, Correct Site, Correct Laterality, Correct Procedure, Correct Position, site marked, Risks and benefits discussed,  Surgical consent,  Pre-op evaluation,  At surgeon's request and post-op pain management  Laterality: Right  Prep: chloraprep       Needles:  Injection technique: Single-shot  Needle Type: Stimiplex     Needle Length: 9cm  Needle Gauge: 21     Additional Needles:   Procedures: ultrasound guided,,,,,,,,  Narrative:  Start time: 05/03/2016 9:10 AM End time: 05/03/2016 9:15 AM Injection made incrementally with aspirations every 5 mL.  Performed by: Personally  Anesthesiologist: Anitra LauthMILLER, Kenyata Napier RAY  Additional Notes: 20 ml 0.5% Ropivacaine

## 2016-05-03 NOTE — Anesthesia Postprocedure Evaluation (Signed)
Anesthesia Post Note  Patient: Stephanie Knight  Procedure(s) Performed: Procedure(s) (LRB): OPEN REDUCTION INTERNAL FIXATION RIGHT (ORIF) ANKLE FRACTURE WEIGHT BEARING DISTAL TIBIA WITH FIXATION TIBIA AND FIBULA (Right)  Patient location during evaluation: PACU Anesthesia Type: Regional Level of consciousness: awake and alert Pain management: pain level controlled Vital Signs Assessment: post-procedure vital signs reviewed and stable Respiratory status: spontaneous breathing, nonlabored ventilation and respiratory function stable Cardiovascular status: blood pressure returned to baseline and stable Postop Assessment: no signs of nausea or vomiting Anesthetic complications: no       Last Vitals:  Vitals:   05/03/16 1145 05/03/16 1227  BP: 124/68 126/67  Pulse: 60 61  Resp: 18 16  Temp:  36.9 C    Last Pain:  Vitals:   05/03/16 1227  TempSrc: Oral  PainSc: 0-No pain                 Lowella CurbWarren Ray Miller

## 2016-05-03 NOTE — Interval H&P Note (Signed)
History and Physical Interval Note:  05/03/2016 9:01 AM  Stephanie Knight  has presented today for surgery, with the diagnosis of RIGHT ANKLE FRACTURE  The various methods of treatment have been discussed with the patient and family. After consideration of risks, benefits and other options for treatment, the patient has consented to  Procedure(s): OPEN REDUCTION INTERNAL FIXATION RIGHT (ORIF) ANKLE FRACTURE WEIGHT BEARING DISTAL TIBIA WITH FIXATION TIBIA AND FIBULA (Right) as a surgical intervention .  The patient's history has been reviewed, patient examined, no change in status, stable for surgery.  I have reviewed the patient's chart and labs.  Questions were answered to the patient's satisfaction.     Kennith Morss D

## 2016-05-04 ENCOUNTER — Encounter (HOSPITAL_BASED_OUTPATIENT_CLINIC_OR_DEPARTMENT_OTHER): Payer: Self-pay | Admitting: Orthopedic Surgery

## 2016-06-22 ENCOUNTER — Encounter: Payer: Self-pay | Admitting: Physical Therapy

## 2016-06-22 ENCOUNTER — Ambulatory Visit: Payer: Self-pay | Attending: Orthopedic Surgery | Admitting: Physical Therapy

## 2016-06-22 DIAGNOSIS — R262 Difficulty in walking, not elsewhere classified: Secondary | ICD-10-CM | POA: Insufficient documentation

## 2016-06-22 DIAGNOSIS — M25671 Stiffness of right ankle, not elsewhere classified: Secondary | ICD-10-CM | POA: Insufficient documentation

## 2016-06-22 DIAGNOSIS — R600 Localized edema: Secondary | ICD-10-CM | POA: Insufficient documentation

## 2016-06-22 DIAGNOSIS — M25571 Pain in right ankle and joints of right foot: Secondary | ICD-10-CM | POA: Insufficient documentation

## 2016-06-22 DIAGNOSIS — M6281 Muscle weakness (generalized): Secondary | ICD-10-CM | POA: Insufficient documentation

## 2016-06-22 NOTE — Therapy (Signed)
[facility] [address] [place], [place], [address] Phone: [phone]   Fax:  [phone]  Physical Therapy Evaluation  Patient Details  Name: Stephanie Knight MRN: [id number] Date of Birth: [birth year] Referring Provider: Margarita Rana, MD  Encounter Date: 06/22/2016      PT End of Session - 06/22/16 0800    Visit Number 1   Number of Visits 13   Date for PT Re-Evaluation 08/03/16   Authorization Type self pay   PT Start Time 0800   PT Stop Time 0855   PT Time Calculation (min) 55 min   Activity Tolerance Patient tolerated treatment well   Behavior During Therapy [facility] for tasks assessed/performed      Past Medical History:  Diagnosis Date  . PONV (postoperative nausea and vomiting)     Past Surgical History:  Procedure Laterality Date  . ORIF ANKLE FRACTURE Right 05/03/2016   Procedure: OPEN REDUCTION INTERNAL FIXATION RIGHT (ORIF) ANKLE FRACTURE WEIGHT BEARING DISTAL TIBIA WITH FIXATION TIBIA AND FIBULA;  Surgeon: Sheral Apley, MD;  Location:  SURGERY CENTER;  Service: Orthopedics;  Laterality: Right;  . Right blocked tear duct      There were no vitals filed for this visit.       Subjective Assessment - 06/22/16 0807    Subjective Pt was in PT after a fall at work- for R ankle, and was released. Cont to have pain and was sent for cortisone injection. Fell down stairs at home after the injection resulting in fracture on 3/13. Was in a splint then cast and placed in boot last week. Is often without the boot at home.    Patient Stated Goals driving, return to work   Currently in Pain? Yes   Pain Score 5    Pain Location Ankle   Pain Orientation Right   Pain Descriptors / Indicators Aching;Tightness   Aggravating Factors  weight bearing            [facility] PT Assessment - 06/22/16 0001      Assessment   Medical Diagnosis s/p ORIF R pilon fracture   Referring Provider Margarita Rana, MD   Onset Date/Surgical Date 05/03/16  fracture on 04/24/16   Hand Dominance Right   Next MD Visit 5/30   Prior Therapy yes- approx 6 weeks     Precautions   Precautions None     Restrictions   Other Position/Activity Restrictions WBAT     Balance Screen   Has the patient fallen in the past 6 months Yes   How many times? 1   Has the patient had a decrease in activity level because of a fear of falling?  Yes   Is the patient reluctant to leave their home because of a fear of falling?  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     Cognition   Overall Cognitive Status Within Functional Limits for tasks assessed     Observation/Other Assessments   Focus on Therapeutic Outcomes (FOTO)  37% ability     Observation/Other Assessments-Edema    Edema Circumferential     Circumferential Edema   Circumferential - Right 30 cm   Circumferential - Left  27.5     Sensation   Additional Comments dorsal foot N/t  ROM / Strength   AROM / PROM / Strength AROM;PROM;Strength     AROM   AROM Assessment Site Ankle   Right/Left Ankle Right   Right Ankle Dorsiflexion -18     PROM   PROM Assessment Site Ankle   Right/Left Ankle Right   Right Ankle Dorsiflexion -10     Strength   Strength Assessment Site Ankle   Right/Left Ankle Right   Right Ankle Dorsiflexion 3-/5   Right Ankle Plantar Flexion 3-/5   Right Ankle Inversion 4/5   Right Ankle Eversion 4-/5     Palpation   Palpation comment sensitive to light touch     Ambulation/Gait   Gait Comments in boot, no AD                   [facility] Adult PT Treatment/Exercise - 06/22/16 0001      Exercises   Exercises Ankle;Other Exercises   Other Exercises  desensitization- towel, pillow case light touch     Modalities    Modalities Vasopneumatic     Vasopneumatic   Number Minutes Vasopneumatic  15 minutes   Vasopnuematic Location  Ankle  R   Vasopneumatic Pressure Low   Vasopneumatic Temperature  34 deg     Ankle Exercises: Stretches   Gastroc Stretch Limitations long sitting with strap     Ankle Exercises: Seated   Heel Slides Limitations sitting edge of bed   Other Seated Ankle Exercises long sitting red band PF, inversion, eversion                PT Education - 06/22/16 0859    Education provided Yes   Education Details anatomy of condition, POC, HEP, exercise form/rationale, desensitization   Person(s) Educated Patient;Child(ren)   Methods Explanation;Demonstration;Tactile cues;Verbal cues;Handout   Comprehension Verbalized understanding;Returned demonstration;Verbal cues required;Tactile cues required;Need further instruction          PT Short Term Goals - 06/22/16 0912      PT SHORT TERM GOAL #1   Title DF ROM to 0 actively by 6/1   Baseline see flowsheet   Time 3   Period Weeks   Status New     PT SHORT TERM GOAL #2   Title 5/5 strength in OCK DF, eversion and inversion   Baseline see flowsheet   Time 3   Period Weeks   Status New     PT SHORT TERM GOAL #3   Title Pt will verbalize at least 50% improvement in hypersensitivity of foot   Baseline began educating on desensitization techniques at eval   Time 6   Period Weeks   Status New           PT Long Term Goals - 06/22/16 0915      PT LONG TERM GOAL #1   Title Pt will be able to drive without any limitations by ankle by 6/22   Baseline unable at eval   Time 6   Period Weeks   Status New     PT LONG TERM GOAL #2   Title Pt will return to work with understanding of RICE and use of exercises to decrease ankle pain and stiffness   Baseline began educating at eval, has not returned to work at this time   Time 6   Period Weeks   Status New     PT LONG TERM GOAL #3   Title FOTO to 56% ability to  indicate significant improvement in functional control   Baseline 37%  ability at eval   Time 6   Period Weeks   Status New     PT LONG TERM GOAL #4   Title Pt will demo at least 10 single leg heel raises to indicate appropriate strength and endurance in gastroc/soleus and accessory stabilizers   Baseline unable at eval   Time 6   Period Weeks   Status New     PT LONG TERM GOAL #5   Title Pt will demo appropraite gait pattern without ankle boot or brace for at least 100 ft without pain   Baseline in boot at eval   Time 6   Period Weeks   Status New               Plan - 06/22/16 2130    Clinical Impression Statement Pt presents to PT with complaints of R ankle pain, edema and limited functional ambulation ability. Pt has just begun walking in boot last week after using scooter. Notable edema around bilateral malleoli and hypersensitivity to dorsal foot. Limited dorsiflexion ROM actively and passively. Incisions are well healed. Pt will benefit from skilled PT in order to improve stated impairments and return to PLOF.    Rehab Potential Good   PT Frequency 2x / week   PT Duration 6 weeks   PT Treatment/Interventions ADLs/Self Care Home Management;Cryotherapy;Electrical Stimulation;Functional mobility training;Stair training;Gait training;Ultrasound;Moist Heat;Therapeutic activities;Therapeutic exercise;Balance training;Neuromuscular re-education;Patient/family education;Passive range of motion;Scar mobilization;Manual techniques;Taping;Vasopneumatic Device   PT Next Visit Plan NUstep, DF stretch, desensitization, foot intrinsics   PT Home Exercise Plan DF stretch, inversion, eversion, PF, desensitization   Consulted and Agree with Plan of Care Patient;Family member/caregiver   Family Member Consulted daughter      Patient will benefit from skilled therapeutic intervention in order to improve the following deficits and impairments:  Abnormal gait, Decreased range of motion,  Difficulty walking, Decreased activity tolerance, Pain, Impaired flexibility, Decreased scar mobility, Decreased mobility, Decreased strength, Increased edema, Impaired sensation  Visit Diagnosis: Pain in right ankle and joints of right foot - Plan: PT plan of care cert/re-cert  Stiffness of right ankle, not elsewhere classified - Plan: PT plan of care cert/re-cert  Localized edema - Plan: PT plan of care cert/re-cert  Difficulty in walking, not elsewhere classified - Plan: PT plan of care cert/re-cert  Muscle weakness (generalized) - Plan: PT plan of care cert/re-cert     Problem List There are no active problems to display for this patient.   Jerome Otter C. Ramsie Ostrander PT, DPT 06/22/16 9:22 AM   [facility] [address] [place], [place], [address] Phone: [phone]   Fax:  [phone]  Name: Stephanie Knight MRN: [id number] Date of Birth: [birth year]

## 2016-06-25 ENCOUNTER — Telehealth: Payer: Self-pay | Admitting: Physical Therapy

## 2016-06-25 ENCOUNTER — Ambulatory Visit: Payer: Self-pay | Admitting: Physical Therapy

## 2016-06-25 NOTE — Telephone Encounter (Signed)
Called patient about missed visit.  She was in route and was about to arrive.  She thought her appointment was at 10:45.  Both her daughter and son checked her appointment for her last night.   I informed her of her next appointment on Thursday, 5/17 at 3:45. Stephanie BeachKaren Milagros Middendorf PTA

## 2016-06-26 ENCOUNTER — Encounter: Payer: Self-pay | Admitting: Physical Therapy

## 2016-06-26 ENCOUNTER — Ambulatory Visit: Payer: Self-pay | Admitting: Physical Therapy

## 2016-06-26 DIAGNOSIS — M6281 Muscle weakness (generalized): Secondary | ICD-10-CM

## 2016-06-26 DIAGNOSIS — M25571 Pain in right ankle and joints of right foot: Secondary | ICD-10-CM

## 2016-06-26 DIAGNOSIS — R262 Difficulty in walking, not elsewhere classified: Secondary | ICD-10-CM

## 2016-06-26 DIAGNOSIS — R600 Localized edema: Secondary | ICD-10-CM

## 2016-06-26 DIAGNOSIS — M25671 Stiffness of right ankle, not elsewhere classified: Secondary | ICD-10-CM

## 2016-06-26 NOTE — Patient Instructions (Signed)
AROM issued from exercise drawer 1-2 x a day 10 x each Hold 1-10 seconds

## 2016-06-26 NOTE — Therapy (Signed)
[facility] Outpatient Rehabilitation [facility] [address] [place], [place], [address] Phone: [phone]   Fax:  [phone]  Physical Therapy Treatment  Patient Details  Name: Stephanie Knight MRN: [id number] Date of Birth: [birth year] Referring Provider: Margarita Rana, MD  Encounter Date: 06/26/2016      PT End of Session - 06/26/16 1328    Visit Number 1   Number of Visits 13   Date for PT Re-Evaluation 08/03/16   PT Start Time 1017   PT Stop Time 1120   PT Time Calculation (min) 63 min   Activity Tolerance Patient tolerated treatment well   Behavior During Therapy [facility] for tasks assessed/performed      Past Medical History:  Diagnosis Date  . PONV (postoperative nausea and vomiting)     Past Surgical History:  Procedure Laterality Date  . ORIF ANKLE FRACTURE Right 05/03/2016   Procedure: OPEN REDUCTION INTERNAL FIXATION RIGHT (ORIF) ANKLE FRACTURE WEIGHT BEARING DISTAL TIBIA WITH FIXATION TIBIA AND FIBULA;  Surgeon: Sheral Apley, MD;  Location: [facility];  Service: Orthopedics;  Laterality: Right;  . Right blocked tear duct      There were no vitals filed for this visit.      Subjective Assessment - 06/26/16 1026    Subjective She was able to do a few of the exercises.    Pain has not improved yet.  She is weaning from boot and walking bare foot in the house. I want to use the game ready.,   Currently in Pain? Yes   Pain Score 5    Pain Location Ankle   Pain Orientation Right;Anterior;Lateral   Pain Descriptors / Indicators Aching;Tightness  sensitive   Aggravating Factors  Boot   Multiple Pain Sites No                         [facility] Adult PT Treatment/Exercise - 06/26/16 0001      Self-Care   Self-Care --  Anatomy chart used for education     Exercises   Other Exercises  Education on how to do     Modalities   Modalities Vasopneumatic     Vasopneumatic   Number Minutes Vasopneumatic  15  minutes   Vasopnuematic Location  Ankle   Vasopneumatic Pressure Low   Vasopneumatic Temperature  34     Ankle Exercises: Stretches   Plantar Fascia Stretch --  tennis ball used 3 minutes   Other Stretch Pro stretch,  ball rolling     Ankle Exercises: Seated   ABC's --  HEP, issued , not practiced   Ankle Circles/Pumps --  HEP not practiced   Towel Crunch --  curls and flicking toes.  achey   Towel Inversion/Eversion --  10 x   [place] Pickup 15  sometimes able to pick up 2 at a time   Heel Raises 10 reps   Toe Raise 10 reps   Heel Slides 10 reps   Heel Slides Limitations 10  ankle joint pain,  anterior     Ankle Exercises: Aerobic   Stationary Bike Nu step UE/LE small ranges.  small motions                PT Education - 06/26/16 1328    Education provided Yes   Education Details HEP,  anatomy   Person(s) Educated Patient   Methods Explanation;Demonstration;Tactile cues;Verbal cues;Handout   Comprehension Verbalized understanding;Returned demonstration  PT Short Term Goals - 06/26/16 1340      PT SHORT TERM GOAL #1   Title DF ROM to 0 actively by 6/1   Time 3   Period Weeks   Status Unable to assess     PT SHORT TERM GOAL #2   Title 5/5 strength in OCK DF, eversion and inversion   Time 3   Period Weeks   Status Unable to assess     PT SHORT TERM GOAL #3   Title Pt will verbalize at least 50% improvement in hypersensitivity of foot   Baseline none yet   Time 6   Period Weeks   Status On-going           PT Long Term Goals - 06/22/16 0915      PT LONG TERM GOAL #1   Title Pt will be able to drive without any limitations by ankle by 6/22   Baseline unable at eval   Time 6   Period Weeks   Status New     PT LONG TERM GOAL #2   Title Pt will return to work with understanding of RICE and use of exercises to decrease ankle pain and stiffness   Baseline began educating at eval, has not returned to work at this time   Time 6    Period Weeks   Status New     PT LONG TERM GOAL #3   Title FOTO to 56% ability to indicate significant improvement in functional control   Baseline 37% ability at eval   Time 6   Period Weeks   Status New     PT LONG TERM GOAL #4   Title Pt will demo at least 10 single leg heel raises to indicate appropriate strength and endurance in gastroc/soleus and accessory stabilizers   Baseline unable at eval   Time 6   Period Weeks   Status New     PT LONG TERM GOAL #5   Title Pt will demo appropraite gait pattern without ankle boot or brace for at least 100 ft without pain   Baseline in boot at eval   Time 6   Period Weeks   Status New               Plan - 06/26/16 1328    Clinical Impression Statement Progress toward HEP.  Pain not yet improved with sensitivity   PT Next Visit Plan NUstep, DF stretch, desensitization, foot intrinsics,  AROM   PT Home Exercise Plan DF stretch, inversion, eversion, PF, desensitization  AROM   Consulted and Agree with Plan of Care Patient;Family member/caregiver   Family Member Consulted Son      Patient will benefit from skilled therapeutic intervention in order to improve the following deficits and impairments:  Abnormal gait, Decreased range of motion, Difficulty walking, Decreased activity tolerance, Pain, Impaired flexibility, Decreased scar mobility, Decreased mobility, Decreased strength, Increased edema, Impaired sensation  Visit Diagnosis: Pain in right ankle and joints of right foot  Stiffness of right ankle, not elsewhere classified  Localized edema  Difficulty in walking, not elsewhere classified  Muscle weakness (generalized)     Problem List There are no active problems to display for this patient.   Mychael Soots PTA 06/26/2016, 1:48 PM  [facility] [address] [place], [place], [address] Phone: [phone]   Fax:  [phone]  Name: Felicity PellegriniKimberly W Lyman MRN:  [id number] Date of Birth: 07/04/1960

## 2016-06-28 ENCOUNTER — Ambulatory Visit: Payer: Self-pay | Admitting: Physical Therapy

## 2016-06-28 DIAGNOSIS — R262 Difficulty in walking, not elsewhere classified: Secondary | ICD-10-CM

## 2016-06-28 DIAGNOSIS — M6281 Muscle weakness (generalized): Secondary | ICD-10-CM

## 2016-06-28 DIAGNOSIS — M25671 Stiffness of right ankle, not elsewhere classified: Secondary | ICD-10-CM

## 2016-06-28 DIAGNOSIS — R600 Localized edema: Secondary | ICD-10-CM

## 2016-06-28 DIAGNOSIS — M25571 Pain in right ankle and joints of right foot: Secondary | ICD-10-CM

## 2016-06-28 NOTE — Therapy (Signed)
[facility] [address] [place], [place], [address] Phone: [phone]   Fax:  [phone]  Physical Therapy Treatment  Patient Details  Name: Stephanie Knight MRN: [id number] Date of Birth: 04/10/1960 Referring Provider: Margarita Ranaimothy Murphy, MD  Encounter Date: 06/28/2016      PT End of Session - 06/28/16 1748    Visit Number 3   Number of Visits 13   Date for PT Re-Evaluation 08/03/16   PT Start Time 1550   PT Stop Time 1630   PT Time Calculation (min) 40 min   Activity Tolerance Patient tolerated treatment well   Behavior During Therapy [facility] for tasks assessed/performed      Past Medical History:  Diagnosis Date  . PONV (postoperative nausea and vomiting)     Past Surgical History:  Procedure Laterality Date  . ORIF ANKLE FRACTURE Right 05/03/2016   Procedure: OPEN REDUCTION INTERNAL FIXATION RIGHT (ORIF) ANKLE FRACTURE WEIGHT BEARING DISTAL TIBIA WITH FIXATION TIBIA AND FIBULA;  Surgeon: Sheral Apleyimothy D Murphy, MD;  Location: [facility];  Service: Orthopedics;  Laterality: Right;  . Right blocked tear duct      There were no vitals filed for this visit.                       [facility] Adult PT Treatment/Exercise - 06/28/16 0001      Ambulation/Gait   Ambulation/Gait Yes   Ambulation Distance (Feet) --  cane,  40 feet X 2 with shoe   Gait Pattern Step-through pattern   Gait Comments less internal rotation with gait today .  cane helpful.  decreases limp     Manual Therapy   Manual therapy comments proximal half fibula AP glides to allow room for foot to pass between tibia, fibula.  Care taken to avoid fracture site..  DF improved      Ankle Exercises: Stretches   Gastroc Stretch 3 reps;30 seconds   Gastroc Stretch Limitations long sitting with strap   Other Stretch Pro stretch,  ball rolling     Ankle Exercises: Seated   Heel Raises 10 reps   Toe Raise 10 reps  painful ankle joint.    Other Seated Ankle Exercises 4 way bands , red 10 X 2                 PT Education - 06/28/16 1748    Education provided Yes   Education Details gait training.     Person(s) Educated Patient;Child(ren)   Methods Explanation;Demonstration;Verbal cues   Comprehension Verbalized understanding;Returned demonstration          PT Short Term Goals - 06/26/16 1340      PT SHORT TERM GOAL #1   Title DF ROM to 0 actively by 6/1   Time 3   Period Weeks   Status Unable to assess     PT SHORT TERM GOAL #2   Title 5/5 strength in OCK DF, eversion and inversion   Time 3   Period Weeks   Status Unable to assess     PT SHORT TERM GOAL #3   Title Pt will verbalize at least 50% improvement in hypersensitivity of foot   Baseline none yet   Time 6   Period Weeks   Status On-going           PT Long Term Goals - 06/22/16 0915      PT LONG TERM GOAL #1   Title Pt will be  able to drive without any limitations by ankle by 6/22   Baseline unable at eval   Time 6   Period Weeks   Status New     PT LONG TERM GOAL #2   Title Pt will return to work with understanding of RICE and use of exercises to decrease ankle pain and stiffness   Baseline began educating at eval, has not returned to work at this time   Time 6   Period Weeks   Status New     PT LONG TERM GOAL #3   Title FOTO to 56% ability to indicate significant improvement in functional control   Baseline 37% ability at eval   Time 6   Period Weeks   Status New     PT LONG TERM GOAL #4   Title Pt will demo at least 10 single leg heel raises to indicate appropriate strength and endurance in gastroc/soleus and accessory stabilizers   Baseline unable at eval   Time 6   Period Weeks   Status New     PT LONG TERM GOAL #5   Title Pt will demo appropraite gait pattern without ankle boot or brace for at least 100 ft without pain   Baseline in boot at eval   Time 6   Period Weeks   Status New                Plan - 06/28/16 1749    Clinical Impression Statement Patient able to get shoe on for gait in clinic for the first time.  Patient wears boot in the community.  0 DF AAROM.  Gait improves with cane, patient will not use one at home.  Sensitivity only with things touching it.  Shoe wearing did not increase sensitivity.      PT Next Visit Plan NUstep, DF stretch, desensitization, foot intrinsics,  AROM,  closed chain when ready,  continue gait training.  Fibula mat need taping when she has weaned from boot.    PT Home Exercise Plan DF stretch, inversion, eversion, PF, desensitization  AROM   Consulted and Agree with Plan of Care Patient;Family member/caregiver   Family Member Consulted Daughter      Patient will benefit from skilled therapeutic intervention in order to improve the following deficits and impairments:  Abnormal gait, Decreased range of motion, Difficulty walking, Decreased activity tolerance, Pain, Impaired flexibility, Decreased scar mobility, Decreased mobility, Decreased strength, Increased edema, Impaired sensation  Visit Diagnosis: Pain in right ankle and joints of right foot  Stiffness of right ankle, not elsewhere classified  Localized edema  Difficulty in walking, not elsewhere classified  Muscle weakness (generalized)     Problem List There are no active problems to display for this patient.   HARRIS,KAREN PTA 06/28/2016, 5:54 PM  [facility] [address] [place], [place], [address] Phone: [phone]   Fax:  [phone]  Name: Stephanie Knight MRN: [id number] Date of Birth: 1960-08-15

## 2016-07-03 ENCOUNTER — Ambulatory Visit: Payer: Self-pay | Admitting: Physical Therapy

## 2016-07-03 ENCOUNTER — Encounter: Payer: Self-pay | Admitting: Physical Therapy

## 2016-07-03 DIAGNOSIS — R262 Difficulty in walking, not elsewhere classified: Secondary | ICD-10-CM

## 2016-07-03 DIAGNOSIS — M25671 Stiffness of right ankle, not elsewhere classified: Secondary | ICD-10-CM

## 2016-07-03 DIAGNOSIS — M6281 Muscle weakness (generalized): Secondary | ICD-10-CM

## 2016-07-03 DIAGNOSIS — M25571 Pain in right ankle and joints of right foot: Secondary | ICD-10-CM

## 2016-07-03 DIAGNOSIS — R600 Localized edema: Secondary | ICD-10-CM

## 2016-07-03 NOTE — Therapy (Signed)
[place], [place], [address] Phone: [phone]   Fax:  [phone]  Physical Therapy Treatment  Patient Details  Name: Stephanie Knight MRN: [id number] Date of Birth: [birth year] Referring Provider: Edmonia Lynch, MD  Encounter Date: 07/03/2016      PT End of Session - 07/03/16 1811    Visit Number 4   Number of Visits 13   Date for PT Re-Evaluation 08/03/16   PT Start Time 1505   PT Stop Time 1600   PT Time Calculation (min) 55 min   Activity Tolerance Patient tolerated treatment well   Behavior During Therapy [facility] for tasks assessed/performed      Past Medical History:  Diagnosis Date  . PONV (postoperative nausea and vomiting)     Past Surgical History:  Procedure Laterality Date  . ORIF ANKLE FRACTURE Right 05/03/2016   Procedure: OPEN REDUCTION INTERNAL FIXATION RIGHT (ORIF) ANKLE FRACTURE WEIGHT BEARING DISTAL TIBIA WITH FIXATION TIBIA AND FIBULA;  Surgeon: Renette Butters, MD;  Location: [place];  Service: Orthopedics;  Laterality: Right;  . Right blocked tear duct      There were no vitals filed for this visit.      Subjective Assessment - 07/03/16 1516    Subjective Mid foot pain with stretching .  No real pain woith walking.  Boot only in community.     Currently in Pain? No/denies   Pain Score --  7/10 with stretch   Pain Location Ankle   Pain Orientation Right;Anterior;Lateral   Pain Descriptors / Indicators Aching;Tender   Aggravating Factors  touching,  boot,  stretching            [facility] PT Assessment - 07/03/16 0001      PROM   Right Ankle Dorsiflexion 4                     [facility] Adult PT Treatment/Exercise - 07/03/16 0001      Knee/Hip Exercises: Seated   Long Arc Quad 2 sets;10 reps   Long Arc Quad Weight 4 lbs.   Long [employer] Limitations 10 second holds   Hamstring Curl 10 reps;2 sets   Hamstring Limitations green band      Vasopneumatic   Number Minutes Vasopneumatic  15 minutes   Vasopnuematic Location  Ankle   Vasopneumatic Pressure Low   Vasopneumatic Temperature  32     Manual Therapy   Manual Therapy --  desentization anterior foot.  Tolerates a shoe well.   Manual therapy comments gentle stretching into DF,  toes and mid foot.  Mobilization with movement  gentle in supine with patient assisting with strap     Ankle Exercises: Stretches   Other Stretch Pro stretch  3 minutes   Other Stretch step stretch 10 x 10 seconds.  able to note gastroc stretch     Ankle Exercises: Aerobic   Stationary Bike Nu step stretching only 5 minutes     Ankle Exercises: Standing   Other Standing Ankle Exercises low march 20 seconds 3 X   Other Standing Ankle Exercises 3 way pillow case slides 10  eacg                  PT Short Term Goals - 07/03/16 1816      PT SHORT TERM GOAL #1   Title DF ROM to 0 actively by 6/1   Baseline PROM 4 degrees Not measured AROM  Time 3   Period Weeks   Status Partially Met     PT SHORT TERM GOAL #2   Title 5/5 strength in OCK DF, eversion and inversion   Time 3   Period Weeks   Status Unable to assess     PT SHORT TERM GOAL #3   Title Pt will verbalize at least 50% improvement in hypersensitivity of foot   Baseline none yet.,  she is not working on this at home. We are working on it in clinic so far   Time 6   Period Weeks   Status On-going           PT Long Term Goals - 06/22/16 0915      PT LONG TERM GOAL #1   Title Pt will be able to drive without any limitations by ankle by 6/22   Baseline unable at eval   Time 6   Period Weeks   Status New     PT LONG TERM GOAL #2   Title Pt will return to work with understanding of RICE and use of exercises to decrease ankle pain and stiffness   Baseline began educating at eval, has not returned to work at this time   Time 6   Period Weeks   Status New     PT LONG TERM GOAL #3   Title FOTO to 56%  ability to indicate significant improvement in functional control   Baseline 37% ability at eval   Time 6   Period Weeks   Status New     PT LONG TERM GOAL #4   Title Pt will demo at least 10 single leg heel raises to indicate appropriate strength and endurance in gastroc/soleus and accessory stabilizers   Baseline unable at eval   Time 6   Period Weeks   Status New     PT LONG TERM GOAL #5   Title Pt will demo appropraite gait pattern without ankle boot or brace for at least 100 ft without pain   Baseline in boot at eval   Time 6   Period Weeks   Status New               Plan - 07/03/16 1811    Clinical Impression Statement DF PROM 4 degrees.  She was able to note stretching in calf for the first time today.  She was able to tolerate more closed kinetic chain exercises today.  Pain 3-4/10after exercise prior to cold.   PT Next Visit Plan NUstep, DF stretch, desensitization, foot intrinsics,  AROM,  closed chain when ready,  continue gait training.  Fibula mat need taping when she has weaned from boot. Progress HEP to closed chain.   PT Home Exercise Plan DF stretch, inversion, eversion, PF, desensitization  AROM   Consulted and Agree with Plan of Care Patient   Family Member Consulted Daughter      Patient will benefit from skilled therapeutic intervention in order to improve the following deficits and impairments:  Abnormal gait, Decreased range of motion, Difficulty walking, Decreased activity tolerance, Pain, Impaired flexibility, Decreased scar mobility, Decreased mobility, Decreased strength, Increased edema, Impaired sensation  Visit Diagnosis: Pain in right ankle and joints of right foot  Stiffness of right ankle, not elsewhere classified  Localized edema  Difficulty in walking, not elsewhere classified  Muscle weakness (generalized)     Problem List There are no active problems to display for this patient.   Bethzy Hauck PTA 07/03/2016, 6:19  PM  [place], [place], [address] Phone: [phone]   Fax:  [phone]  Name: YAA DONNELLAN MRN: [id number] Date of Birth: [birth year]

## 2016-07-06 ENCOUNTER — Ambulatory Visit: Payer: Self-pay | Admitting: Physical Therapy

## 2016-07-06 DIAGNOSIS — M25671 Stiffness of right ankle, not elsewhere classified: Secondary | ICD-10-CM

## 2016-07-06 DIAGNOSIS — R600 Localized edema: Secondary | ICD-10-CM

## 2016-07-06 DIAGNOSIS — M6281 Muscle weakness (generalized): Secondary | ICD-10-CM

## 2016-07-06 DIAGNOSIS — R262 Difficulty in walking, not elsewhere classified: Secondary | ICD-10-CM

## 2016-07-06 DIAGNOSIS — M25571 Pain in right ankle and joints of right foot: Secondary | ICD-10-CM

## 2016-07-10 NOTE — Therapy (Signed)
[place], [place], [address] Phone: [phone]   Fax:  [phone]  Physical Therapy Treatment  Patient Details  Name: Stephanie Knight MRN: [id number] Date of Birth: Dec 09, 1960 Referring Provider: Edmonia Lynch, MD  Encounter Date: 07/06/2016   Visit # 5 of 13  Date for re-evaluation: 08/03/2016  Time in: 1017 Time out: 1110       Past Medical History:  Diagnosis Date  . PONV (postoperative nausea and vomiting)     Past Surgical History:  Procedure Laterality Date  . ORIF ANKLE FRACTURE Right 05/03/2016   Procedure: OPEN REDUCTION INTERNAL FIXATION RIGHT (ORIF) ANKLE FRACTURE WEIGHT BEARING DISTAL TIBIA WITH FIXATION TIBIA AND FIBULA;  Surgeon: Renette Butters, MD;  Location: [place];  Service: Orthopedics;  Laterality: Right;  . Right blocked tear duct      There were no vitals filed for this visit.      Subjective: I am not wearing the boot at home. I walk barefoot. Sensitivity is improving. I can get my shoe on now.   Pain level: 3/10  Pain locations: Right ankle, anterior  Pain description: Aching Pain aggravators: boot Pain relievers: rubbing it.                 [facility] Adult PT Treatment/Exercise - 07/10/16 0001      Modalities   Modalities Cryotherapy     Cryotherapy   Number Minutes Cryotherapy 10 Minutes   Cryotherapy Location Ankle   Type of Cryotherapy Ice pack  Vaso unavailable      Manual Therapy   Manual therapy comments Gentle A/P mobilizations followed by PROM DF, INV,, Pronation     Ankle Exercises: Standing   Other Standing Ankle Exercises low march 20 seconds 3 X   Other Standing Ankle Exercises 2 way pillow case slides 10  each , abduction and extension      Ankle Exercises: Aerobic   Stationary Bike Nu step stretching only 5 minutes  cues for comfortable ROM     Ankle Exercises: Seated   BAPS Level 3;10 reps  2 way, then  circles each way   Other Seated Ankle Exercises 4 way bands , red 10 X 2   supine, difficult inversion     Ankle Exercises: Stretches   Other Stretch 2 inch step stretch standing behind chair , heel on floor                   PT Short Term Goals - 07/03/16 1816      PT SHORT TERM GOAL #1   Title DF ROM to 0 actively by 6/1   Baseline PROM 4 degrees Not measured AROM   Time 3   Period Weeks   Status Partially Met     PT SHORT TERM GOAL #2   Title 5/5 strength in OCK DF, eversion and inversion   Time 3   Period Weeks   Status Unable to assess     PT SHORT TERM GOAL #3   Title Pt will verbalize at least 50% improvement in hypersensitivity of foot   Baseline none yet.,  she is not working on this at home. We are working on it in clinic so far   Time 6   Period Weeks   Status On-going           PT Long Term Goals - 06/22/16 0915      PT LONG TERM GOAL #1  Title Pt will be able to drive without any limitations by ankle by 6/22   Baseline unable at eval   Time 6   Period Weeks   Status New     PT LONG TERM GOAL #2   Title Pt will return to work with understanding of RICE and use of exercises to decrease ankle pain and stiffness   Baseline began educating at eval, has not returned to work at this time   Time 6   Period Weeks   Status New     PT LONG TERM GOAL #3   Title FOTO to 56% ability to indicate significant improvement in functional control   Baseline 37% ability at eval   Time 6   Period Weeks   Status New     PT LONG TERM GOAL #4   Title Pt will demo at least 10 single leg heel raises to indicate appropriate strength and endurance in gastroc/soleus and accessory stabilizers   Baseline unable at eval   Time 6   Period Weeks   Status New     PT LONG TERM GOAL #5   Title Pt will demo appropraite gait pattern without ankle boot or brace for at least 100 ft without pain   Baseline in boot at eval   Time 6   Period Weeks   Status New      Clinical Impression:   Pt sees MD soon. Worked on gait mechanics in shoe working on roll through without increased pain. Difficulty coordinating movement with BAPS, especially pronation and inversion. Able to improve mobility after tactile cues.   PT plan next visit :  NUstep, DF stretch, desensitization, foot intrinsics,  AROM,  closed chain when ready,  continue gait training.  Fibula mat need taping when she has weaned from boot. Progress HEP to closed chain.       Patient will benefit from skilled therapeutic intervention in order to improve the following deficits and impairments:  Abnormal gait, Decreased range of motion, Difficulty walking, Decreased activity tolerance, Pain, Impaired flexibility, Decreased scar mobility, Decreased mobility, Decreased strength, Increased edema, Impaired sensation  Visit Diagnosis: Pain in right ankle and joints of right foot  Stiffness of right ankle, not elsewhere classified  Localized edema  Difficulty in walking, not elsewhere classified  Muscle weakness (generalized)     Problem List There are no active problems to display for this patient.   Dorene Ar, [place] 07/10/2016, 8:08 AM  [place], [place], [address] Phone: [phone]   Fax:  [phone]  Name: Stephanie Knight MRN: [id number] Date of Birth: 05/30/1960

## 2016-07-11 ENCOUNTER — Ambulatory Visit: Payer: Self-pay | Admitting: Physical Therapy

## 2016-07-11 ENCOUNTER — Encounter: Payer: Self-pay | Admitting: Physical Therapy

## 2016-07-11 DIAGNOSIS — R600 Localized edema: Secondary | ICD-10-CM

## 2016-07-11 DIAGNOSIS — M25671 Stiffness of right ankle, not elsewhere classified: Secondary | ICD-10-CM

## 2016-07-11 DIAGNOSIS — M6281 Muscle weakness (generalized): Secondary | ICD-10-CM

## 2016-07-11 DIAGNOSIS — R262 Difficulty in walking, not elsewhere classified: Secondary | ICD-10-CM

## 2016-07-11 DIAGNOSIS — M25571 Pain in right ankle and joints of right foot: Secondary | ICD-10-CM

## 2016-07-11 NOTE — Therapy (Signed)
[place], [place], [address] Phone: [phone]   Fax:  [phone]  Physical Therapy Treatment  Patient Details  Name: Stephanie Knight MRN: [id number] Date of Birth: Mar 14, 1960 Referring Provider: Edmonia Lynch, MD  Encounter Date: 07/11/2016      PT End of Session - 07/11/16 7948    Visit Number 6   Number of Visits 13   Date for PT Re-Evaluation 08/03/16   PT Start Time 0165   PT Stop Time 1645   PT Time Calculation (min) 56 min   Activity Tolerance Patient tolerated treatment well   Behavior During Therapy [facility] for tasks assessed/performed      Past Medical History:  Diagnosis Date  . PONV (postoperative nausea and vomiting)     Past Surgical History:  Procedure Laterality Date  . ORIF ANKLE FRACTURE Right 05/03/2016   Procedure: OPEN REDUCTION INTERNAL FIXATION RIGHT (ORIF) ANKLE FRACTURE WEIGHT BEARING DISTAL TIBIA WITH FIXATION TIBIA AND FIBULA;  Surgeon: Renette Butters, MD;  Location: [place];  Service: Orthopedics;  Laterality: Right;  . Right blocked tear duct      There were no vitals filed for this visit.      Subjective Assessment - 07/11/16 1558    Subjective Saw MD.  I have a new brace (ASO)  I can wean from the boot as able.  X rays show I am healing well.  I am to continue PT .  Next MD appointment 27 June.  Sensitivity improved a little. i have been trying to do the exercises more, especially thre active moves.  Able to drive now.  Has not tried yet.   Patient is accompained by: Family member  Daughter   Currently in Pain? Yes   Pain Score 3    Pain Location Ankle   Pain Orientation Right;Anterior   Pain Descriptors / Indicators Aching   Pain Relieving Factors rubbing it, massage   Effect of Pain on Daily Activities not yet driving            [facility] PT Assessment - 07/11/16 0001      PROM   Right Ankle Dorsiflexion -2  measured prior to  stretches     Strength   Right Ankle Dorsiflexion 4-/5  within available range   Right Ankle Plantar Flexion 4/5   Right Ankle Inversion 4/5   Right Ankle Eversion 4-/5                     [facility] Adult PT Treatment/Exercise - 07/11/16 0001      Vasopneumatic   Number Minutes Vasopneumatic  15 minutes   Vasopnuematic Location  Ankle   Vasopneumatic Pressure Low   Vasopneumatic Temperature  32     Manual Therapy   Manual therapy comments A/P ankle joint with movement to increase DF,  gentle PROM lateral ankle stiff     Ankle Exercises: Aerobic   Stationary Bike Nu step,  5 minutes legs only      Ankle Exercises: Seated   BAPS Sitting;Level 3;10 reps;Limitations  DF/PF  10 x AROM with cues,  supination/pronation  AA 10    Other Seated Ankle Exercises 4 way bands 10 X each green (too hard) mod assist for compensations.     Ankle Exercises: Stretches   Other Stretch pro stretch 10 x 10 seconds each: PF/DF     Ankle Exercises: Standing   Heel Raises 10 reps  moves forward vs  lifts up.  rocker board 10 X                  PT Short Term Goals - 07/03/16 1816      PT SHORT TERM GOAL #1   Title DF ROM to 0 actively by 6/1   Baseline PROM 4 degrees Not measured AROM   Time 3   Period Weeks   Status Partially Met     PT SHORT TERM GOAL #2   Title 5/5 strength in OCK DF, eversion and inversion   Time 3   Period Weeks   Status Unable to assess     PT SHORT TERM GOAL #3   Title Pt will verbalize at least 50% improvement in hypersensitivity of foot   Baseline none yet.,  she is not working on this at home. We are working on it in clinic so far   Time 6   Period Weeks   Status On-going           PT Long Term Goals - 06/22/16 0915      PT LONG TERM GOAL #1   Title Pt will be able to drive without any limitations by ankle by 6/22   Baseline unable at eval   Time 6   Period Weeks   Status New     PT LONG TERM GOAL #2   Title Pt will return  to work with understanding of RICE and use of exercises to decrease ankle pain and stiffness   Baseline began educating at eval, has not returned to work at this time   Time 6   Period Weeks   Status New     PT LONG TERM GOAL #3   Title FOTO to 56% ability to indicate significant improvement in functional control   Baseline 37% ability at eval   Time 6   Period Weeks   Status New     PT LONG TERM GOAL #4   Title Pt will demo at least 10 single leg heel raises to indicate appropriate strength and endurance in gastroc/soleus and accessory stabilizers   Baseline unable at eval   Time 6   Period Weeks   Status New     PT LONG TERM GOAL #5   Title Pt will demo appropraite gait pattern without ankle boot or brace for at least 100 ft without pain   Baseline in boot at eval   Time 6   Period Weeks   Status New               Plan - 07/11/16 1731    Clinical Impression Statement Less pain with  ASO brace.  Ankle stiff today -2 DF.  Able to progress toward closed chain exercise today,. Strength in open chain improving,   PT Next Visit Plan NUstep, DF stretch, desensitization, foot intrinsics,  AROM,  closed chain when ready,  continue gait training.  Progress HEP to closed chain.  save time for manual soft tissue work.    PT Home Exercise Plan DF stretch, inversion, eversion, PF, desensitization  AROM   Consulted and Agree with Plan of Care Patient;Family member/caregiver   Family Member Consulted Daughter      Patient will benefit from skilled therapeutic intervention in order to improve the following deficits and impairments:  Abnormal gait, Decreased range of motion, Difficulty walking, Decreased activity tolerance, Pain, Impaired flexibility, Decreased scar mobility, Decreased mobility, Decreased strength, Increased edema, Impaired sensation  Visit Diagnosis: Pain in right ankle  and joints of right foot  Stiffness of right ankle, not elsewhere classified  Localized  edema  Difficulty in walking, not elsewhere classified  Muscle weakness (generalized)     Problem List There are no active problems to display for this patient.   Emylia Latella PTA 07/11/2016, 5:36 PM  [place], [place], [address] Phone: [phone]   Fax:  [phone]  Name: Stephanie Knight MRN: [id number] Date of Birth: 06-29-1960

## 2016-07-12 ENCOUNTER — Ambulatory Visit: Payer: Self-pay | Admitting: Physical Therapy

## 2016-07-12 ENCOUNTER — Encounter: Payer: Self-pay | Admitting: Physical Therapy

## 2016-07-12 DIAGNOSIS — M25671 Stiffness of right ankle, not elsewhere classified: Secondary | ICD-10-CM

## 2016-07-12 DIAGNOSIS — R600 Localized edema: Secondary | ICD-10-CM

## 2016-07-12 DIAGNOSIS — M25571 Pain in right ankle and joints of right foot: Secondary | ICD-10-CM

## 2016-07-12 DIAGNOSIS — M6281 Muscle weakness (generalized): Secondary | ICD-10-CM

## 2016-07-12 DIAGNOSIS — R262 Difficulty in walking, not elsewhere classified: Secondary | ICD-10-CM

## 2016-07-12 NOTE — Therapy (Signed)
[place], [place], [address] Phone: [phone]   Fax:  [phone]  Physical Therapy Treatment  Patient Details  Name: Stephanie Knight MRN: [id number] Date of Birth: 03/07/1960 Referring Provider: Edmonia Lynch, MD  Encounter Date: 07/12/2016      PT End of Session - 07/12/16 8786    Visit Number 7   Number of Visits 13   Date for PT Re-Evaluation 08/03/16   PT Start Time 1502   PT Stop Time 1555   PT Time Calculation (min) 53 min   Activity Tolerance Patient tolerated treatment well   Behavior During Therapy [facility] for tasks assessed/performed      Past Medical History:  Diagnosis Date  . PONV (postoperative nausea and vomiting)     Past Surgical History:  Procedure Laterality Date  . ORIF ANKLE FRACTURE Right 05/03/2016   Procedure: OPEN REDUCTION INTERNAL FIXATION RIGHT (ORIF) ANKLE FRACTURE WEIGHT BEARING DISTAL TIBIA WITH FIXATION TIBIA AND FIBULA;  Surgeon: Renette Butters, MD;  Location: [place];  Service: Orthopedics;  Laterality: Right;  . Right blocked tear duct      There were no vitals filed for this visit.      Subjective Assessment - 07/12/16 1507    Subjective New brace is doing OK.  Has not tried to drive yet.    Patient is accompained by: Family member  Son   Currently in Pain? No/denies   Pain Location Ankle                         [facility] Adult PT Treatment/Exercise - 07/12/16 0001      Vasopneumatic   Number Minutes Vasopneumatic  10 minutes   Vasopnuematic Location  Ankle   Vasopneumatic Pressure Low   Vasopneumatic Temperature  32     Manual Therapy   Manual Therapy --  scar tissue mobs and lower leg lateral soft tissue work RT   Manual therapy comments A/P ankle joint with movement to increase DF,  gentle PROM lateral ankle stiff     Ankle Exercises: Aerobic   Stationary Bike 5 minutes L1  1.2 miles     Ankle Exercises:  Seated   Heel Raises 10 reps   Toe Raise 10 reps     Ankle Exercises: Standing   Heel Raises 10 reps   Other Standing Ankle Exercises  wall slides facing wall 10 x each right forward,  5 X right behind     Ankle Exercises: Machines for Strengthening   Cybex Leg Press 1 plate , 2 Legs 10 x 2 sets  Knee machine curls 15 LBS 10 x 2 sets both     Ankle Exercises: Stretches   Gastroc Stretch 5 reps   Gastroc Stretch Limitations 15 seconds                PT Education - 07/12/16 1748    Education provided Yes   Education Details hep   Person(s) Educated Patient;Child(ren)   Methods Explanation;Demonstration;Verbal cues;Handout   Comprehension Verbalized understanding;Returned demonstration          PT Short Term Goals - 07/12/16 1751      PT SHORT TERM GOAL #1   Title DF ROM to 0 actively by 6/1   Baseline 0 today, consistant?   Time 3   Period Weeks   Status Partially Met     PT SHORT TERM GOAL #2   Title 5/5  strength in OCK DF, eversion and inversion   Baseline see flow sheet from 07/11/16   Time 3   Period Weeks   Status On-going     PT SHORT TERM GOAL #3   Title Pt will verbalize at least 50% improvement in hypersensitivity of foot   Baseline a little less sensitive   Time 6   Period Weeks   Status On-going           PT Long Term Goals - 06/22/16 0915      PT LONG TERM GOAL #1   Title Pt will be able to drive without any limitations by ankle by 6/22   Baseline unable at eval   Time 6   Period Weeks   Status New     PT LONG TERM GOAL #2   Title Pt will return to work with understanding of RICE and use of exercises to decrease ankle pain and stiffness   Baseline began educating at eval, has not returned to work at this time   Time 6   Period Weeks   Status New     PT LONG TERM GOAL #3   Title FOTO to 56% ability to indicate significant improvement in functional control   Baseline 37% ability at eval   Time 6   Period Weeks   Status New      PT LONG TERM GOAL #4   Title Pt will demo at least 10 single leg heel raises to indicate appropriate strength and endurance in gastroc/soleus and accessory stabilizers   Baseline unable at eval   Time 6   Period Weeks   Status New     PT LONG TERM GOAL #5   Title Pt will demo appropraite gait pattern without ankle boot or brace for at least 100 ft without pain   Baseline in boot at eval   Time 6   Period Weeks   Status New               Plan - 07/12/16 1749    Clinical Impression Statement 0 AROM DF.  Progress toward her HEP goals and ROM goals.  Pain 4/10 post exercise, prior to game ready.   PT Next Visit Plan NUstep, DF stretch, desensitization, foot intrinsics,  AROM,  closed chain when ready,  continue gait training.  Progress HEP to closed chain.  save time for manual soft tissue work.    PT Home Exercise Plan DF stretch, inversion, eversion, PF, desensitization  AROM,  heel lifts standing.   Consulted and Agree with Plan of Care Patient;Family member/caregiver   Family Member Consulted Son      Patient will benefit from skilled therapeutic intervention in order to improve the following deficits and impairments:  Abnormal gait, Decreased range of motion, Difficulty walking, Decreased activity tolerance, Pain, Impaired flexibility, Decreased scar mobility, Decreased mobility, Decreased strength, Increased edema, Impaired sensation  Visit Diagnosis: Pain in right ankle and joints of right foot  Stiffness of right ankle, not elsewhere classified  Localized edema  Difficulty in walking, not elsewhere classified  Muscle weakness (generalized)     Problem List There are no active problems to display for this patient.   HARRIS,KAREN PTA 07/12/2016, 5:53 PM  [facility] [address] [place], [place], [address] Phone: [phone]   Fax:  [phone]  Name: Stephanie Knight MRN: [id number] Date of  Birth: 1960/04/12

## 2016-07-12 NOTE — Patient Instructions (Signed)
Heel raise  from exercise drawer. 1-2 x a day 10 X each both feet

## 2016-07-16 ENCOUNTER — Encounter: Payer: Self-pay | Admitting: Physical Therapy

## 2016-07-16 ENCOUNTER — Ambulatory Visit: Payer: Self-pay | Attending: Orthopedic Surgery | Admitting: Physical Therapy

## 2016-07-16 DIAGNOSIS — M25671 Stiffness of right ankle, not elsewhere classified: Secondary | ICD-10-CM | POA: Insufficient documentation

## 2016-07-16 DIAGNOSIS — R600 Localized edema: Secondary | ICD-10-CM | POA: Insufficient documentation

## 2016-07-16 DIAGNOSIS — M6281 Muscle weakness (generalized): Secondary | ICD-10-CM | POA: Insufficient documentation

## 2016-07-16 DIAGNOSIS — R262 Difficulty in walking, not elsewhere classified: Secondary | ICD-10-CM | POA: Insufficient documentation

## 2016-07-16 DIAGNOSIS — M25571 Pain in right ankle and joints of right foot: Secondary | ICD-10-CM | POA: Insufficient documentation

## 2016-07-16 NOTE — Therapy (Signed)
[place], [place], [address] Phone: [phone]   Fax:  [phone]  Physical Therapy Treatment  Patient Details  Name: Stephanie Knight MRN: [id number] Date of Birth: [birth year] Referring Provider: Edmonia Lynch, MD  Encounter Date: 07/16/2016      PT End of Session - 07/16/16 1547    Visit Number 8   Number of Visits 13   Date for PT Re-Evaluation 08/03/16   Authorization Type self pay   PT Start Time 6283   PT Stop Time 1645   PT Time Calculation (min) 58 min   Activity Tolerance Patient tolerated treatment well   Behavior During Therapy [facility] for tasks assessed/performed      Past Medical History:  Diagnosis Date  . PONV (postoperative nausea and vomiting)     Past Surgical History:  Procedure Laterality Date  . ORIF ANKLE FRACTURE Right 05/03/2016   Procedure: OPEN REDUCTION INTERNAL FIXATION RIGHT (ORIF) ANKLE FRACTURE WEIGHT BEARING DISTAL TIBIA WITH FIXATION TIBIA AND FIBULA;  Surgeon: Renette Butters, MD;  Location: [facility];  Service: Orthopedics;  Laterality: Right;  . Right blocked tear duct      There were no vitals filed for this visit.      Subjective Assessment - 07/16/16 1547    Subjective foot is feeling pretty good overall. Hurts at the top of her foot where the ankle bends. wears ASO only when she leaves the house.    Patient Stated Goals driving, return to work   Pain Score 3    Pain Location Ankle   Pain Orientation Right;Anterior   Pain Descriptors / Indicators Aching   Aggravating Factors  long distance walking   Pain Relieving Factors rest            [facility] PT Assessment - 07/16/16 0001      Assessment   Medical Diagnosis s/p ORIF R pilon fracture   Referring Provider Edmonia Lynch, MD   Onset Date/Surgical Date 05/03/16     PROM   Right Ankle Dorsiflexion 4                     [facility] Adult PT Treatment/Exercise - 07/16/16  0001      Exercises   Exercises Knee/Hip     Knee/Hip Exercises: Supine   Bridges Limitations bridge on heels x15   Straight Leg Raise with External Rotation 15 reps     Knee/Hip Exercises: Sidelying   Hip ABduction 15 reps   Hip ABduction Limitations cues for DF + eversion     Vasopneumatic   Number Minutes Vasopneumatic  15 minutes   Vasopnuematic Location  Ankle   Vasopneumatic Pressure Low   Vasopneumatic Temperature  32     Manual Therapy   Manual therapy comments DF PROM, AP talocrural at end range DF, distraction in DF     Ankle Exercises: Seated   Ankle Circles/Pumps Other (comment)  ABCs   Other Seated Ankle Exercises red tband resisted DF     Ankle Exercises: Aerobic   Stationary Bike nu step 5 min L5     Ankle Exercises: Stretches   Engineer, agricultural Limitations 20s                PT Education - 07/16/16 1634    Education provided Yes   Education Details exercise form/rationale, POC, arch supports, wearing ASO   Person(s) Educated Patient   Methods Explanation;Demonstration;Tactile  cues;Verbal cues;Handout   Comprehension Returned demonstration;Verbalized understanding;Verbal cues required;Tactile cues required;Need further instruction          PT Short Term Goals - 07/12/16 1751      PT SHORT TERM GOAL #1   Title DF ROM to 0 actively by 6/1   Baseline 0 today, consistant?   Time 3   Period Weeks   Status Partially Met     PT SHORT TERM GOAL #2   Title 5/5 strength in OCK DF, eversion and inversion   Baseline see flow sheet from 07/11/16   Time 3   Period Weeks   Status On-going     PT SHORT TERM GOAL #3   Title Pt will verbalize at least 50% improvement in hypersensitivity of foot   Baseline a little less sensitive   Time 6   Period Weeks   Status On-going           PT Long Term Goals - 06/22/16 0915      PT LONG TERM GOAL #1   Title Pt will be able to drive without any limitations by ankle by 6/22    Baseline unable at eval   Time 6   Period Weeks   Status New     PT LONG TERM GOAL #2   Title Pt will return to work with understanding of RICE and use of exercises to decrease ankle pain and stiffness   Baseline began educating at eval, has not returned to work at this time   Time 6   Period Weeks   Status New     PT LONG TERM GOAL #3   Title FOTO to 56% ability to indicate significant improvement in functional control   Baseline 37% ability at eval   Time 6   Period Weeks   Status New     PT LONG TERM GOAL #4   Title Pt will demo at least 10 single leg heel raises to indicate appropriate strength and endurance in gastroc/soleus and accessory stabilizers   Baseline unable at eval   Time 6   Period Weeks   Status New     PT LONG TERM GOAL #5   Title Pt will demo appropraite gait pattern without ankle boot or brace for at least 100 ft without pain   Baseline in boot at eval   Time 6   Period Weeks   Status New               Plan - 07/16/16 1635    Clinical Impression Statement Improving ROM and strength notable. End range DF pain at anterior talocrural joint that is minimal and occasional. notable flexible arch in R foot, will monitor for possible arch support in shoe.    PT Treatment/Interventions ADLs/Self Care Home Management;Cryotherapy;Electrical Stimulation;Functional mobility training;Stair training;Gait training;Ultrasound;Moist Heat;Therapeutic activities;Therapeutic exercise;Balance training;Neuromuscular re-education;Patient/family education;Passive range of motion;Scar mobilization;Manual techniques;Taping;Vasopneumatic Device   PT Next Visit Plan foot intrinsic strength, hip strengthening, CKC with decreased ext rot   PT Home Exercise Plan DF stretch, inversion, eversion, PF, desensitization  AROM,  heel lifts standing; SLR with ER, hip abduction, arch shortening   Consulted and Agree with Plan of Care Patient      Patient will benefit from skilled  therapeutic intervention in order to improve the following deficits and impairments:  Abnormal gait, Decreased range of motion, Difficulty walking, Decreased activity tolerance, Pain, Impaired flexibility, Decreased scar mobility, Decreased mobility, Decreased strength, Increased edema, Impaired sensation  Visit Diagnosis: Pain in right  ankle and joints of right foot  Stiffness of right ankle, not elsewhere classified  Localized edema  Difficulty in walking, not elsewhere classified  Muscle weakness (generalized)     Problem List There are no active problems to display for this patient.  Macallan Ord C. Erica Richwine PT, DPT 07/16/16 4:44 PM   [facility] [address] [place], [place], [address] Phone: [phone]   Fax:  [phone]  Name: SHANEE BATCH MRN: [id number] Date of Birth: [birth year]

## 2016-07-18 ENCOUNTER — Encounter: Payer: Self-pay | Admitting: Physical Therapy

## 2016-07-18 ENCOUNTER — Ambulatory Visit: Payer: Self-pay | Admitting: Physical Therapy

## 2016-07-18 DIAGNOSIS — R262 Difficulty in walking, not elsewhere classified: Secondary | ICD-10-CM

## 2016-07-18 DIAGNOSIS — R600 Localized edema: Secondary | ICD-10-CM

## 2016-07-18 DIAGNOSIS — M6281 Muscle weakness (generalized): Secondary | ICD-10-CM

## 2016-07-18 DIAGNOSIS — M25671 Stiffness of right ankle, not elsewhere classified: Secondary | ICD-10-CM

## 2016-07-18 DIAGNOSIS — M25571 Pain in right ankle and joints of right foot: Secondary | ICD-10-CM

## 2016-07-18 NOTE — Therapy (Signed)
[place], [place], [address] Phone: [phone]   Fax:  [phone]  Physical Therapy Treatment  Patient Details  Name: Stephanie Knight MRN: [id number] Date of Birth: 1960/08/06 Referring Provider: Edmonia Lynch, MD  Encounter Date: 07/18/2016      PT End of Session - 07/18/16 1544    Visit Number 9   Number of Visits 13   Date for PT Re-Evaluation 08/03/16   Authorization Type self pay   PT Start Time 8099   PT Stop Time 1645   PT Time Calculation (min) 61 min   Activity Tolerance Patient tolerated treatment well   Behavior During Therapy [facility] for tasks assessed/performed      Past Medical History:  Diagnosis Date  . PONV (postoperative nausea and vomiting)     Past Surgical History:  Procedure Laterality Date  . ORIF ANKLE FRACTURE Right 05/03/2016   Procedure: OPEN REDUCTION INTERNAL FIXATION RIGHT (ORIF) ANKLE FRACTURE WEIGHT BEARING DISTAL TIBIA WITH FIXATION TIBIA AND FIBULA;  Surgeon: Renette Butters, MD;  Location: [facility];  Service: Orthopedics;  Laterality: Right;  . Right blocked tear duct      There were no vitals filed for this visit.      Subjective Assessment - 07/18/16 1544    Subjective Top part (anterior ankle) is hurting a little today. Mobilizations last time made it less achey. Is back to driving without difficulty.    Patient Stated Goals driving, return to work   Currently in Pain? Yes   Pain Score 3    Pain Location Ankle   Pain Orientation Right;Anterior   Pain Descriptors / Indicators Aching                         [facility] Adult PT Treatment/Exercise - 07/18/16 0001      Knee/Hip Exercises: Standing   Gait Training heel toe, reducing everision & supination, arm swing     Knee/Hip Exercises: Sidelying   Hip ABduction 20 reps   Hip ABduction Limitations yellow tband around feet     Knee/Hip Exercises: Prone   Hamstring Curl  20 reps  both   Hamstring Curl Limitations yellow tband around ankles, cues for neutral rotation   Hip Extension 15 reps   Hip Extension Limitations knees to 90, yellow band around ankles, glut set to hip ext     Vasopneumatic   Number Minutes Vasopneumatic  15 minutes   Vasopnuematic Location  Ankle   Vasopneumatic Pressure Low   Vasopneumatic Temperature  32     Manual Therapy   Manual therapy comments end range DF on slant board PA talocrural mobs x2 in session  3x30s     Ankle Exercises: Aerobic   Stationary Bike nu step 5 min L5     Ankle Exercises: Standing   Other Standing Ankle Exercises heel toe step onto airex   Other Standing Ankle Exercises step taps with SLS control                  PT Short Term Goals - 07/12/16 1751      PT SHORT TERM GOAL #1   Title DF ROM to 0 actively by 6/1   Baseline 0 today, consistant?   Time 3   Period Weeks   Status Partially Met     PT SHORT TERM GOAL #2   Title 5/5 strength in OCK DF, eversion and inversion   Baseline  see flow sheet from 07/11/16   Time 3   Period Weeks   Status On-going     PT SHORT TERM GOAL #3   Title Pt will verbalize at least 50% improvement in hypersensitivity of foot   Baseline a little less sensitive   Time 6   Period Weeks   Status On-going           PT Long Term Goals - 06/22/16 0915      PT LONG TERM GOAL #1   Title Pt will be able to drive without any limitations by ankle by 6/22   Baseline unable at eval   Time 6   Period Weeks   Status New     PT LONG TERM GOAL #2   Title Pt will return to work with understanding of RICE and use of exercises to decrease ankle pain and stiffness   Baseline began educating at eval, has not returned to work at this time   Time 6   Period Weeks   Status New     PT LONG TERM GOAL #3   Title FOTO to 56% ability to indicate significant improvement in functional control   Baseline 37% ability at eval   Time 6   Period Weeks   Status New      PT LONG TERM GOAL #4   Title Pt will demo at least 10 single leg heel raises to indicate appropriate strength and endurance in gastroc/soleus and accessory stabilizers   Baseline unable at eval   Time 6   Period Weeks   Status New     PT LONG TERM GOAL #5   Title Pt will demo appropraite gait pattern without ankle boot or brace for at least 100 ft without pain   Baseline in boot at eval   Time 6   Period Weeks   Status New               Plan - 07/18/16 1637    Clinical Impression Statement Transitioned to CKC exercises and spent a large part of the treatment today working on gait pattern without brace. Pt tends to walk with excessive LE turnout which was reduced today. Pt was educated on soreness likely to result after mobilizations today.    PT Treatment/Interventions ADLs/Self Care Home Management;Cryotherapy;Electrical Stimulation;Functional mobility training;Stair training;Gait training;Ultrasound;Moist Heat;Therapeutic activities;Therapeutic exercise;Balance training;Neuromuscular re-education;Patient/family education;Passive range of motion;Scar mobilization;Manual techniques;Taping;Vasopneumatic Device   PT Next Visit Plan foot intrinsic strength, hip strengthening, CKC with decreased ext rot   PT Home Exercise Plan DF stretch, inversion, eversion, PF, desensitization  AROM,  heel lifts standing; SLR with ER, hip abduction, arch shortening   Consulted and Agree with Plan of Care Patient      Patient will benefit from skilled therapeutic intervention in order to improve the following deficits and impairments:  Abnormal gait, Decreased range of motion, Difficulty walking, Decreased activity tolerance, Pain, Impaired flexibility, Decreased scar mobility, Decreased mobility, Decreased strength, Increased edema, Impaired sensation  Visit Diagnosis: Pain in right ankle and joints of right foot  Stiffness of right ankle, not elsewhere classified  Localized  edema  Difficulty in walking, not elsewhere classified  Muscle weakness (generalized)     Problem List There are no active problems to display for this patient.   Judea Fennimore C. Anyelo Mccue PT, DPT 07/18/16 5:27 PM   [facility] [address] [place], [place], [address] Phone: [phone]   Fax:  [phone]  Name: Stephanie Knight MRN:  [id number] Date of Birth: 08-13-1960

## 2016-07-24 ENCOUNTER — Ambulatory Visit: Payer: Self-pay | Admitting: Physical Therapy

## 2016-07-24 DIAGNOSIS — R262 Difficulty in walking, not elsewhere classified: Secondary | ICD-10-CM

## 2016-07-24 DIAGNOSIS — M25571 Pain in right ankle and joints of right foot: Secondary | ICD-10-CM

## 2016-07-24 DIAGNOSIS — R600 Localized edema: Secondary | ICD-10-CM

## 2016-07-24 DIAGNOSIS — M6281 Muscle weakness (generalized): Secondary | ICD-10-CM

## 2016-07-24 DIAGNOSIS — M25671 Stiffness of right ankle, not elsewhere classified: Secondary | ICD-10-CM

## 2016-07-24 NOTE — Patient Instructions (Addendum)
    Garen LahLawrie Thayne Cindric, PT Certified Exercise Expert for the Aging Adult  07/24/16 9:05 AM Phone: [phone] Fax: [phone]

## 2016-07-24 NOTE — Therapy (Signed)
[facility] Outpatient Rehabilitation [facility] [address] [place], [place], [address] Phone: [phone]   Fax:  [phone]  Physical Therapy Treatment  Patient Details  Name: Stephanie Knight MRN: [id number] Date of Birth: [birth year] Referring Provider: Margarita Rana, MD  Encounter Date: 07/24/2016      PT End of Session - 07/24/16 0844    Visit Number 10   Number of Visits 13   Date for PT Re-Evaluation 08/03/16   Authorization Type self pay   PT Start Time 0845   PT Stop Time 0930   PT Time Calculation (min) 45 min   Activity Tolerance Patient tolerated treatment well   Behavior During Therapy [facility] for tasks assessed/performed      Past Medical History:  Diagnosis Date  . PONV (postoperative nausea and vomiting)     Past Surgical History:  Procedure Laterality Date  . ORIF ANKLE FRACTURE Right 05/03/2016   Procedure: OPEN REDUCTION INTERNAL FIXATION RIGHT (ORIF) ANKLE FRACTURE WEIGHT BEARING DISTAL TIBIA WITH FIXATION TIBIA AND FIBULA;  Surgeon: Sheral Apley, MD;  Location: [facility];  Service: Orthopedics;  Laterality: Right;  . Right blocked tear duct      There were no vitals filed for this visit.      Subjective Assessment - 07/24/16 0845    Subjective not hurting today.  I am driving now.  I do go up and down steps without problems            [facility] PT Assessment - 07/24/16 0919      Observation/Other Assessments   Focus on Therapeutic Outcomes (FOTO)  FOTO intake 62% limitation 38% predicted 44%     PROM   Right Ankle Dorsiflexion 5                     [facility] Adult PT Treatment/Exercise - 07/24/16 0918      Knee/Hip Exercises: Standing   Gait Training heel toe, reducing everision & supination, arm swing   Other Standing Knee Exercises heel raise on steps bil  1 x 15 with 10 second hold stretch with DF, single limb stance clocks 2 x 10 with two finger balance on ll bars on right side x 2   Other  Standing Knee Exercises on level surface single limb stance and throwing ball x 10 with  attempting to throw ball whle SLS, pt with need for UE support  to maintain balance.  also  stood on ther Ex cushion to simulate unlevel ground for 2 minutes     Knee/Hip Exercises: Sidelying   Hip ABduction 20 reps   Hip ABduction Limitations red t band     Knee/Hip Exercises: Prone   Hamstring Curl 20 reps  both   Hamstring Curl Limitations red t band   Hip Extension 15 reps   Hip Extension Limitations knees to 90, red band around ankles, glut set to hip ext     Vasopneumatic   Number Minutes Vasopneumatic  --  declined today.     Manual Therapy   Manual therapy comments DF PROM, AP talocrural at end range DF, distraction in DF     Ankle Exercises: Standing   Other Standing Ankle Exercises heel toe step onto airex                PT Education - 07/24/16 0853    Education provided Yes   Education Details reviewed exercises and added proprioceptive exercises    Person(s) Educated Patient  Methods Explanation;Demonstration;Tactile cues;Verbal cues;Handout   Comprehension Verbalized understanding;Returned demonstration          PT Short Term Goals - 07/24/16 0917      PT SHORT TERM GOAL #1   Title DF ROM to 0 actively by 6/1   Baseline 5 on right actively   Time 3   Period Weeks   Status Achieved     PT SHORT TERM GOAL #2   Title 5/5 strength in OCK DF, eversion and inversion   Baseline see flow sheet from 07/11/16   Time 3   Period Weeks   Status On-going     PT SHORT TERM GOAL #3   Title Pt will verbalize at least 50% improvement in hypersensitivity of foot   Baseline no pain today , just slightly from working on SLS   Time 6   Period Weeks   Status Achieved           PT Long Term Goals - 07/24/16 [id number]      PT LONG TERM GOAL #1   Title Pt will be able to drive without any limitations by ankle by 6/22   Baseline able to drive  without limitations   Time 6    Period Weeks   Status Achieved     PT LONG TERM GOAL #2   Title Pt will return to work with understanding of RICE and use of exercises to decrease ankle pain and stiffness   Baseline not able to return to work yet   Time 6   Period Weeks   Status On-going     PT LONG TERM GOAL #3   Title FOTO to 56% ability to indicate significant improvement in functional control   Baseline FOTO intake status 62% today   Time 6   Period Weeks   Status Achieved     PT LONG TERM GOAL #4   Title Pt will demo at least 10 single leg heel raises to indicate appropriate strength and endurance in gastroc/soleus and accessory stabilizers   Baseline requires UE support for balance   Time 6   Period Weeks   Status Unable to assess     PT LONG TERM GOAL #5   Title Pt will demo appropraite gait pattern without ankle boot or brace for at least 100 ft without pain   Baseline pt with ER of right foot with gait   Time 6   Period Weeks   Status On-going               Plan - 07/24/16 1045    Clinical Impression Statement Pt has one more visit before DC.  Pt reports no pain on initiation of exercises and only slight pain with SLS and proprioceptive exercises.  Pt FOTO taken with intake 62% limitation 38%  predicted limitation was 44% so FOTO goals achieved.  Pt right DF was 5 degrees after manual therapy.  Pt was reminded she might have additional soreness due to new exercise .  Pt will return for last appt next week before DC   Rehab Potential Good   PT Frequency 2x / week   PT Duration 6 weeks   PT Treatment/Interventions ADLs/Self Care Home Management;Cryotherapy;Electrical Stimulation;Functional mobility training;Stair training;Gait training;Ultrasound;Moist Heat;Therapeutic activities;Therapeutic exercise;Balance training;Neuromuscular re-education;Patient/family education;Passive range of motion;Scar mobilization;Manual techniques;Taping;Vasopneumatic Device   PT Next Visit Plan foot intrinsic  strength, hip strengthening, CKC with decreased ext rot. PT FOTO taken this week   PT Home Exercise Plan DF stretch, inversion, eversion, PF,  desensitization  AROM,  heel lifts standing; SLR with ER, hip abduction, arch shortening, ankle and knee proprioception exercises   Consulted and Agree with Plan of Care Patient      Patient will benefit from skilled therapeutic intervention in order to improve the following deficits and impairments:  Abnormal gait, Decreased range of motion, Difficulty walking, Decreased activity tolerance, Pain, Impaired flexibility, Decreased scar mobility, Decreased mobility, Decreased strength, Increased edema, Impaired sensation  Visit Diagnosis: Pain in right ankle and joints of right foot  Stiffness of right ankle, not elsewhere classified  Localized edema  Difficulty in walking, not elsewhere classified  Muscle weakness (generalized)     Problem List There are no active problems to display for this patient.   Garen Lah, PT Certified Exercise Expert for the Aging Adult  07/24/16 11:02 AM Phone: [phone] Fax: [phone]  [facility] Outpatient Rehabilitation [facility] [address] [place], [place], [address] Phone: [phone]   Fax:  [phone]  Name: CHRISTYNE MCCAIN MRN: [id number] Date of Birth: 04-14-1960

## 2016-07-26 ENCOUNTER — Ambulatory Visit: Payer: Self-pay | Admitting: Physical Therapy

## 2016-07-26 ENCOUNTER — Encounter: Payer: Self-pay | Admitting: Physical Therapy

## 2016-07-26 DIAGNOSIS — M25671 Stiffness of right ankle, not elsewhere classified: Secondary | ICD-10-CM

## 2016-07-26 DIAGNOSIS — M25571 Pain in right ankle and joints of right foot: Secondary | ICD-10-CM

## 2016-07-26 DIAGNOSIS — M6281 Muscle weakness (generalized): Secondary | ICD-10-CM

## 2016-07-26 DIAGNOSIS — R600 Localized edema: Secondary | ICD-10-CM

## 2016-07-26 DIAGNOSIS — R262 Difficulty in walking, not elsewhere classified: Secondary | ICD-10-CM

## 2016-07-26 NOTE — Therapy (Signed)
[facility] Outpatient Rehabilitation [facility] [address] [place], [place], [address] Phone: [phone]   Fax:  [phone]  Physical Therapy Treatment  Patient Details  Name: Stephanie Knight MRN: [id number] Date of Birth: 08/29/1960 Referring Provider: Margarita Rana, MD  Encounter Date: 07/26/2016      PT End of Session - 07/26/16 0844    Visit Number 11   Number of Visits 13   Date for PT Re-Evaluation 08/03/16   Authorization Type self pay   PT Start Time 0844   PT Stop Time 0934   PT Time Calculation (min) 50 min   Activity Tolerance Patient tolerated treatment well   Behavior During Therapy [facility] for tasks assessed/performed      Past Medical History:  Diagnosis Date  . PONV (postoperative nausea and vomiting)     Past Surgical History:  Procedure Laterality Date  . ORIF ANKLE FRACTURE Right 05/03/2016   Procedure: OPEN REDUCTION INTERNAL FIXATION RIGHT (ORIF) ANKLE FRACTURE WEIGHT BEARING DISTAL TIBIA WITH FIXATION TIBIA AND FIBULA;  Surgeon: Sheral Apley, MD;  Location: [facility];  Service: Orthopedics;  Laterality: Right;  . Right blocked tear duct      There were no vitals filed for this visit.      Subjective Assessment - 07/26/16 0847    Subjective ankle is hurting this morning, I don't know why.                          [facility] Adult PT Treatment/Exercise - 07/26/16 0001      Knee/Hip Exercises: Stretches   Gastroc Stretch Both;2 reps;30 seconds     Knee/Hip Exercises: Standing   Gait Training heel toe, step length, reduce turnout     Modalities   Modalities Moist Heat     Moist Heat Therapy   Number Minutes Moist Heat 3 Minutes  concurrent with education   Moist Heat Location Ankle     Cryotherapy   Number Minutes Cryotherapy 10 Minutes   Cryotherapy Location Ankle   Type of Cryotherapy Ice pack     Manual Therapy   Manual therapy comments DF stretch, AP talocrural mobs     Ankle  Exercises: Standing   Other Standing Ankle Exercises IR with LE ADD red tband on pole     Ankle Exercises: Seated   Other Seated Ankle Exercises active eversion with flat foot   Other Seated Ankle Exercises stool scoots towel bw feet                  PT Short Term Goals - 07/24/16 0917      PT SHORT TERM GOAL #1   Title DF ROM to 0 actively by 6/1   Baseline 5 on right actively   Time 3   Period Weeks   Status Achieved     PT SHORT TERM GOAL #2   Title 5/5 strength in OCK DF, eversion and inversion   Baseline see flow sheet from 07/11/16   Time 3   Period Weeks   Status On-going     PT SHORT TERM GOAL #3   Title Pt will verbalize at least 50% improvement in hypersensitivity of foot   Baseline no pain today , just slightly from working on SLS   Time 6   Period Weeks   Status Achieved           PT Long Term Goals - 07/24/16 3244  PT LONG TERM GOAL #1   Title Pt will be able to drive without any limitations by ankle by 6/22   Baseline able to drive  without limitations   Time 6   Period Weeks   Status Achieved     PT LONG TERM GOAL #2   Title Pt will return to work with understanding of RICE and use of exercises to decrease ankle pain and stiffness   Baseline not able to return to work yet   Time 6   Period Weeks   Status On-going     PT LONG TERM GOAL #3   Title FOTO to 56% ability to indicate significant improvement in functional control   Baseline FOTO intake status 62% today   Time 6   Period Weeks   Status Achieved     PT LONG TERM GOAL #4   Title Pt will demo at least 10 single leg heel raises to indicate appropriate strength and endurance in gastroc/soleus and accessory stabilizers   Baseline requires UE support for balance   Time 6   Period Weeks   Status Unable to assess     PT LONG TERM GOAL #5   Title Pt will demo appropraite gait pattern without ankle boot or brace for at least 100 ft without pain   Baseline pt with ER of  right foot with gait   Time 6   Period Weeks   Status On-going               Plan - 07/26/16 0848    Clinical Impression Statement Pt arrived to PT ambulating with excessive turnout in R foot today and was educated on alignment of talocrural joint and DF limitations with turnout. Unable to evert foot with foot flat on ground indicating weakness in peroneals necessary for proper gait pattern.    PT Treatment/Interventions ADLs/Self Care Home Management;Cryotherapy;Electrical Stimulation;Functional mobility training;Stair training;Gait training;Ultrasound;Moist Heat;Therapeutic activities;Therapeutic exercise;Balance training;Neuromuscular re-education;Patient/family education;Passive range of motion;Scar mobilization;Manual techniques;Taping;Vasopneumatic Device   PT Next Visit Plan foot intrinsic strength, hip strengthening, CKC with decreased ext rot. eversion strength   PT Home Exercise Plan DF stretch, inversion, eversion, PF, desensitization  AROM,  heel lifts standing; SLR with ER, hip abduction, arch shortening, ankle and knee proprioception exercises   Consulted and Agree with Plan of Care Patient      Patient will benefit from skilled therapeutic intervention in order to improve the following deficits and impairments:  Abnormal gait, Decreased range of motion, Difficulty walking, Decreased activity tolerance, Pain, Impaired flexibility, Decreased scar mobility, Decreased mobility, Decreased strength, Increased edema, Impaired sensation  Visit Diagnosis: Pain in right ankle and joints of right foot  Stiffness of right ankle, not elsewhere classified  Localized edema  Difficulty in walking, not elsewhere classified  Muscle weakness (generalized)     Problem List There are no active problems to display for this patient.   Chanika Byland C. Wilfred Dayrit PT, DPT 07/26/16 9:26 AM   [facility] [address] [place], [id number] Phone: [phone]   Fax:  [phone]  Name: Stephanie Knight MRN: [id number] Date of Birth: 06/19/1960

## 2016-07-30 ENCOUNTER — Encounter: Payer: Self-pay | Admitting: Physical Therapy

## 2016-07-30 ENCOUNTER — Ambulatory Visit: Payer: Self-pay | Admitting: Physical Therapy

## 2016-07-30 DIAGNOSIS — R262 Difficulty in walking, not elsewhere classified: Secondary | ICD-10-CM

## 2016-07-30 DIAGNOSIS — R600 Localized edema: Secondary | ICD-10-CM

## 2016-07-30 DIAGNOSIS — M25671 Stiffness of right ankle, not elsewhere classified: Secondary | ICD-10-CM

## 2016-07-30 DIAGNOSIS — M25571 Pain in right ankle and joints of right foot: Secondary | ICD-10-CM

## 2016-07-30 DIAGNOSIS — M6281 Muscle weakness (generalized): Secondary | ICD-10-CM

## 2016-07-30 NOTE — Therapy (Addendum)
[place], [place], [address] Phone: [phone]   Fax:  [phone]  Physical Therapy Treatment/Discharge Summary  Patient Details  Name: Stephanie Knight MRN: [id number] Date of Birth: [birth year] Referring Provider: Edmonia Lynch, MD  Encounter Date: 07/30/2016      PT End of Session - 07/30/16 1550    Visit Number 12   Number of Visits 13   Date for PT Re-Evaluation 08/03/16   Authorization Type self pay   PT Start Time 1550   PT Stop Time 1610   PT Time Calculation (min) 20 min   Activity Tolerance Patient tolerated treatment well   Behavior During Therapy [facility] for tasks assessed/performed      Past Medical History:  Diagnosis Date  . PONV (postoperative nausea and vomiting)     Past Surgical History:  Procedure Laterality Date  . ORIF ANKLE FRACTURE Right 05/03/2016   Procedure: OPEN REDUCTION INTERNAL FIXATION RIGHT (ORIF) ANKLE FRACTURE WEIGHT BEARING DISTAL TIBIA WITH FIXATION TIBIA AND FIBULA;  Surgeon: Renette Butters, MD;  Location: [place];  Service: Orthopedics;  Laterality: Right;  . Right blocked tear duct      There were no vitals filed for this visit.      Subjective Assessment - 07/30/16 1551    Subjective Has to leave early today to pick up daughter. Would like to at least get mobilizations today.    Patient Stated Goals driving, return to work   Currently in Pain? Yes   Pain Score 2    Pain Location Ankle   Pain Orientation Right;Anterior                         [facility] Adult PT Treatment/Exercise - 07/30/16 0001      Knee/Hip Exercises: Standing   Gait Training heel toe to reduce turnout     Manual Therapy   Manual therapy comments DF mob & stretch     Ankle Exercises: Seated   Other Seated Ankle Exercises red tband resisted DF and eversion                  PT Short Term Goals - 07/24/16 0917      PT SHORT TERM  GOAL #1   Title DF ROM to 0 actively by 6/1   Baseline 5 on right actively   Time 3   Period Weeks   Status Achieved     PT SHORT TERM GOAL #2   Title 5/5 strength in OCK DF, eversion and inversion   Baseline see flow sheet from 07/11/16   Time 3   Period Weeks   Status On-going     PT SHORT TERM GOAL #3   Title Pt will verbalize at least 50% improvement in hypersensitivity of foot   Baseline no pain today , just slightly from working on SLS   Time 6   Period Weeks   Status Achieved           PT Long Term Goals - 07/24/16 9892      PT LONG TERM GOAL #1   Title Pt will be able to drive without any limitations by ankle by 6/22   Baseline able to drive  without limitations   Time 6   Period Weeks   Status Achieved     PT LONG TERM GOAL #2   Title Pt will return to work with understanding of RICE and use of  exercises to decrease ankle pain and stiffness   Baseline not able to return to work yet   Time 6   Period Weeks   Status On-going     PT LONG TERM GOAL #3   Title FOTO to 56% ability to indicate significant improvement in functional control   Baseline FOTO intake status 62% today   Time 6   Period Weeks   Status Achieved     PT LONG TERM GOAL #4   Title Pt will demo at least 10 single leg heel raises to indicate appropriate strength and endurance in gastroc/soleus and accessory stabilizers   Baseline requires UE support for balance   Time 6   Period Weeks   Status Unable to assess     PT LONG TERM GOAL #5   Title Pt will demo appropraite gait pattern without ankle boot or brace for at least 100 ft without pain   Baseline pt with ER of right foot with gait   Time 6   Period Weeks   Status On-going               Plan - 07/30/16 1620    Clinical Impression Statement Pt had to leave early today to pick up daughter, requested DF mobs. Pt was able to ambulate without pain and without excessive turnout following treatment. Would like for me to teach  her daughter DF mob.    PT Treatment/Interventions ADLs/Self Care Home Management;Cryotherapy;Electrical Stimulation;Functional mobility training;Stair training;Gait training;Ultrasound;Moist Heat;Therapeutic activities;Therapeutic exercise;Balance training;Neuromuscular re-education;Patient/family education;Passive range of motion;Scar mobilization;Manual techniques;Taping;Vasopneumatic Device   PT Next Visit Plan teach daughter closed chain DF mob, d/c   PT Home Exercise Plan DF stretch, inversion, eversion, PF, desensitization  AROM,  heel lifts standing; SLR with ER, hip abduction, arch shortening, ankle and knee proprioception exercises   Consulted and Agree with Plan of Care Patient      Patient will benefit from skilled therapeutic intervention in order to improve the following deficits and impairments:  Abnormal gait, Decreased range of motion, Difficulty walking, Decreased activity tolerance, Pain, Impaired flexibility, Decreased scar mobility, Decreased mobility, Decreased strength, Increased edema, Impaired sensation  Visit Diagnosis: Pain in right ankle and joints of right foot  Stiffness of right ankle, not elsewhere classified  Localized edema  Difficulty in walking, not elsewhere classified  Muscle weakness (generalized)     Problem List There are no active problems to display for this patient.   Terianne Thaker C. Marisha Renier PT, DPT 07/30/16 4:23 PM   [facility] [address] [place], [place], [address] Phone: [phone]   Fax:  [phone]  Name: Stephanie Knight MRN: [id number] Date of Birth: 1960-08-03  PHYSICAL THERAPY DISCHARGE SUMMARY  Visits from Start of Care: 12  Current functional level related to goals / functional outcomes: See above   Remaining deficits: See above   Education / Equipment: Anatomy of condition, POC, HEP, exercise form/[place]  Plan: Patient agrees to discharge.  Patient goals  were partially met. Patient is being discharged due to not returning since the last visit.  ?????     Calan Doren C. Salley Boxley PT, DPT 09/20/16 6:08 PM

## 2016-08-01 ENCOUNTER — Ambulatory Visit: Payer: Self-pay | Admitting: Physical Therapy

## 2018-03-28 ENCOUNTER — Other Ambulatory Visit (HOSPITAL_COMMUNITY): Payer: Self-pay | Admitting: *Deleted

## 2018-03-28 DIAGNOSIS — N644 Mastodynia: Secondary | ICD-10-CM

## 2018-04-07 ENCOUNTER — Other Ambulatory Visit: Payer: Self-pay | Admitting: Medical

## 2018-04-07 DIAGNOSIS — Z124 Encounter for screening for malignant neoplasm of cervix: Secondary | ICD-10-CM

## 2018-04-07 NOTE — Progress Notes (Addendum)
Patient: Stephanie Knight           Date of Birth: 03-01-1960           MRN: [id number] Visit Date: 04/07/2018 PCP: Patient, No Pcp Per  Cervical Cancer Screening Do you smoke?: Yes Have you ever had or been told you have an allergy to latex products?: No Marital status: Single Date of last pap smear: More than 5 yrs ago Date of last menstrual period: 02/13/11 Number of pregnancies: 0 Number of births: 0 Have you ever had any of the following? Hysterectomy: No Tubal ligation (tubes tied): No Abnormal bleeding: No Abnormal pap smear: No Venereal warts: No A sex partner with venereal warts: No A high risk* sex partner: No  Cervical Exam  Abnormal Observations: No abnormal findings Patient reports occasional scant red discharge Recommendations: Await Pap smear. Follow up as indicated.      Patient's History There are no active problems to display for this patient.  Past Medical History:  Diagnosis Date  . PONV (postoperative nausea and vomiting)     No family history on file.

## 2018-04-07 NOTE — Addendum Note (Signed)
Addended by: Lynnell Dike on: 04/07/2018 08:11 PM   Modules accepted: Orders

## 2018-04-11 LAB — CYTOLOGY - PAP
ADEQUACY: ABSENT
DIAGNOSIS: NEGATIVE

## 2018-04-17 ENCOUNTER — Other Ambulatory Visit: Payer: Self-pay

## 2018-04-17 ENCOUNTER — Ambulatory Visit (HOSPITAL_COMMUNITY): Payer: Self-pay

## 2018-05-19 ENCOUNTER — Encounter (HOSPITAL_COMMUNITY): Payer: Self-pay

## 2019-04-06 ENCOUNTER — Encounter: Payer: Self-pay | Admitting: Family Medicine

## 2019-04-06 ENCOUNTER — Other Ambulatory Visit: Payer: Self-pay

## 2019-04-06 ENCOUNTER — Ambulatory Visit: Payer: Self-pay | Attending: Family Medicine | Admitting: Family Medicine

## 2019-04-06 DIAGNOSIS — N3941 Urge incontinence: Secondary | ICD-10-CM

## 2019-04-06 DIAGNOSIS — Z1159 Encounter for screening for other viral diseases: Secondary | ICD-10-CM

## 2019-04-06 DIAGNOSIS — R350 Frequency of micturition: Secondary | ICD-10-CM

## 2019-04-06 MED ORDER — OXYBUTYNIN CHLORIDE 5 MG PO TABS: 5.0000 mg | ORAL_TABLET | Freq: Two times a day (BID) | ORAL | 1 refills | Status: DC

## 2019-04-06 NOTE — Progress Notes (Signed)
Patient verified DOB Patient has eaten today. Patient is not on current medication. Patient denies pain at this time.

## 2019-04-06 NOTE — Progress Notes (Signed)
Virtual Visit via Telephone Note  I connected with Stephanie Knight, on 04/06/2019 at 1:38 PM by telephone due to the COVID-19 pandemic and verified that I am speaking with the correct person using two identifiers.   Consent: I discussed the limitations, risks, security and privacy concerns of performing an evaluation and management service by telephone and the availability of in person appointments. I also discussed with the patient that there may be a patient responsible charge related to this service. The patient expressed understanding and agreed to proceed.   Location of Patient: Home  Location of Provider: Clinic   Persons participating in Telemedicine visit: Sincerity [place] [place]-CMA Dr. Margarita Rana     History of Present Illness: Stephanie Knight presents today to establish care.  She has not had a primary care physician in a while. Complains of urinary frequency for the last couple of months with no burning, back pain, fever. She has been drinking a lot of water and uses a drink mix a lot with the water which she thinks might be responsible for her drinking a lot. Sometimes has urgency and is having to use pads to prevent accidents; she has also noticed stress incontinence as well. She does have fibroids and is wondering if urinary problems has anything to do with her fibroids  Past Medical History:  Diagnosis Date  . PONV (postoperative nausea and vomiting)    Allergies  Allergen Reactions  . Aspirin Nausea And Vomiting    No current outpatient medications on file prior to visit.   No current facility-administered medications on file prior to visit.    Observations/Objective: Awake, alert, oriented x3 Not in acute distress  Assessment and Plan: 1. Urinary frequency We will need to exclude UTI and diabetes Could be secondary to urge incontinence - CMP14+EGFR; Future - Lipid panel; Future - Hemoglobin A1c; Future - Urinalysis, microscopic only;  Future  2. Urge incontinence of urine We will treat with oxybutynin Counseled on Kegel exercises - oxybutynin (DITROPAN) 5 MG tablet; Take 1 tablet (5 mg total) by mouth 2 (two) times daily.  Dispense: 60 tablet; Refill: 1  3. Screening for viral disease - HCV RNA quant rflx ultra or genotyp([facility]); Future - HIV Antibody (routine testing w rflx); Future   Follow Up Instructions: Return in about 1 day (around 04/07/2019) for Fasting labs and complete physical exam in 1 month.    I discussed the assessment and treatment plan with the patient. The patient was provided an opportunity to ask questions and all were answered. The patient agreed with the plan and demonstrated an understanding of the instructions.   The patient was advised to call back or seek an in-person evaluation if the symptoms worsen or if the condition fails to improve as anticipated.     I provided 15 minutes total of non-face-to-face time during this encounter including median intraservice time, reviewing previous notes, investigations, ordering medications, medical decision making, coordinating care and patient verbalized understanding at the end of the visit.     Charlott Rakes, MD, FAAFP. [facility] and [place], [place]   04/06/2019, 1:38 PM

## 2019-04-08 ENCOUNTER — Other Ambulatory Visit: Payer: Self-pay

## 2019-04-08 ENCOUNTER — Ambulatory Visit: Payer: Medicaid Other | Attending: Family Medicine

## 2019-04-08 DIAGNOSIS — Z1159 Encounter for screening for other viral diseases: Secondary | ICD-10-CM

## 2019-04-08 DIAGNOSIS — R350 Frequency of micturition: Secondary | ICD-10-CM

## 2019-04-09 LAB — URINALYSIS, MICROSCOPIC ONLY
Casts: NONE SEEN /lpf
Epithelial Cells (non renal): >10 /hpf — AB (ref 0–10)
WBC, UA: >30 /hpf — AB (ref 0–5)

## 2019-04-10 LAB — LIPID PANEL
Chol/HDL Ratio: 3.1 ratio (ref 0.0–4.4)
Cholesterol, Total: 157 mg/dL (ref 100–199)
HDL: 51 mg/dL (ref >39)
LDL Chol Calc (NIH): 88 mg/dL (ref 0–99)
Triglycerides: 98 mg/dL (ref 0–149)
VLDL Cholesterol Cal: 18 mg/dL (ref 5–40)

## 2019-04-10 LAB — HCV RNA QUANT RFLX ULTRA OR GENOTYP: HCV Quant Baseline: NOT DETECTED IU/mL

## 2019-04-10 LAB — CMP14+EGFR
ALT: 10 IU/L (ref 0–32)
AST: 13 IU/L (ref 0–40)
Albumin/Globulin Ratio: 1.8 (ref 1.2–2.2)
Albumin: 3.7 g/dL — ABNORMAL LOW (ref 3.8–4.8)
Alkaline Phosphatase: 102 IU/L (ref 39–117)
BUN/Creatinine Ratio: 10 — ABNORMAL LOW (ref 12–28)
BUN: 8 mg/dL (ref 8–27)
Bilirubin Total: <0.2 mg/dL (ref 0.0–1.2)
CO2: 27 mmol/L (ref 20–29)
Calcium: 8.8 mg/dL (ref 8.7–10.3)
Chloride: 107 mmol/L — ABNORMAL HIGH (ref 96–106)
Creatinine, Ser: 0.82 mg/dL (ref 0.57–1.00)
GFR calc Af Amer: 89 mL/min/1.73
GFR calc non Af Amer: 77 mL/min/1.73
Globulin, Total: 2.1 g/dL (ref 1.5–4.5)
Glucose: 89 mg/dL (ref 65–99)
Potassium: 4.2 mmol/L (ref 3.5–5.2)
Sodium: 145 mmol/L — ABNORMAL HIGH (ref 134–144)
Total Protein: 5.8 g/dL — ABNORMAL LOW (ref 6.0–8.5)

## 2019-04-10 LAB — HEMOGLOBIN A1C
Est. average glucose Bld gHb Est-mCnc: 111 mg/dL
Hgb A1c MFr Bld: 5.5 % (ref 4.8–5.6)

## 2019-04-10 LAB — HIV ANTIBODY (ROUTINE TESTING W REFLEX): HIV Screen 4th Generation wRfx: NONREACTIVE

## 2019-04-13 ENCOUNTER — Telehealth: Payer: Self-pay

## 2019-04-13 NOTE — Telephone Encounter (Signed)
Patient name and DOB has been verified Patient was informed of lab results. Patient had no questions.  Patient was set up for a lab appointment for a urine culture.

## 2019-04-13 NOTE — Telephone Encounter (Signed)
-----   Message from Hoy Register, MD sent at 04/12/2019  4:08 PM EST ----- Cholesterol is normal, she tested negative for Diabetes. Can you please have the lab add on a urine culture? Thanks.

## 2019-04-14 ENCOUNTER — Ambulatory Visit: Payer: Self-pay | Attending: Family Medicine

## 2019-04-14 ENCOUNTER — Other Ambulatory Visit: Payer: Self-pay

## 2019-04-14 DIAGNOSIS — R350 Frequency of micturition: Secondary | ICD-10-CM

## 2019-04-17 LAB — URINE CULTURE

## 2019-04-19 ENCOUNTER — Other Ambulatory Visit: Payer: Self-pay | Admitting: Family Medicine

## 2019-04-19 MED ORDER — CEPHALEXIN 500 MG PO CAPS: 500.0000 mg | ORAL_CAPSULE | Freq: Two times a day (BID) | ORAL | 0 refills | Status: DC

## 2019-04-20 ENCOUNTER — Telehealth: Payer: Self-pay

## 2019-04-20 ENCOUNTER — Other Ambulatory Visit: Payer: Self-pay

## 2019-04-20 MED ORDER — CEPHALEXIN 500 MG PO CAPS: 500.0000 mg | ORAL_CAPSULE | Freq: Two times a day (BID) | ORAL | 0 refills | Status: DC

## 2019-04-20 NOTE — Telephone Encounter (Signed)
Patient name and DOB has been verified Patient was informed of lab results. Patient had no questions.  

## 2019-04-20 NOTE — Telephone Encounter (Signed)
-----   Message from Hoy Register, MD sent at 04/19/2019  8:44 AM EST ----- Urine showed UTI. I have sent a rx for abx to her pharmacy

## 2019-05-25 ENCOUNTER — Ambulatory Visit: Payer: Medicaid Other | Attending: Family Medicine | Admitting: Family Medicine

## 2019-05-25 ENCOUNTER — Other Ambulatory Visit: Payer: Self-pay

## 2019-05-25 ENCOUNTER — Encounter: Payer: Self-pay | Admitting: Family Medicine

## 2019-05-25 ENCOUNTER — Encounter: Payer: Self-pay | Admitting: Gastroenterology

## 2019-05-25 VITALS — BP 130/78 | HR 67 | Ht 67.0 in | Wt 273.0 lb

## 2019-05-25 DIAGNOSIS — Z1231 Encounter for screening mammogram for malignant neoplasm of breast: Secondary | ICD-10-CM

## 2019-05-25 DIAGNOSIS — Z1211 Encounter for screening for malignant neoplasm of colon: Secondary | ICD-10-CM

## 2019-05-25 DIAGNOSIS — N3941 Urge incontinence: Secondary | ICD-10-CM | POA: Insufficient documentation

## 2019-05-25 DIAGNOSIS — Z0000 Encounter for general adult medical examination without abnormal findings: Secondary | ICD-10-CM | POA: Diagnosis not present

## 2019-05-25 DIAGNOSIS — Z720 Tobacco use: Secondary | ICD-10-CM | POA: Insufficient documentation

## 2019-05-25 DIAGNOSIS — F17210 Nicotine dependence, cigarettes, uncomplicated: Secondary | ICD-10-CM

## 2019-05-25 MED ORDER — OXYBUTYNIN CHLORIDE 5 MG PO TABS: 5.0000 mg | ORAL_TABLET | Freq: Three times a day (TID) | ORAL | 1 refills | Status: DC

## 2019-05-25 NOTE — Patient Instructions (Signed)

## 2019-05-25 NOTE — Progress Notes (Signed)
Frequent urination, no burning no itching.  Pain in middle of breast.

## 2019-05-25 NOTE — Progress Notes (Signed)
Subjective:  Patient ID: Stephanie Knight, female    DOB: 02/12/1961  Age: 59 y.o. MRN: [id number]  CC: Annual Exam   HPI SIMAYA LUMADUE presents for an annual physical exam. She is due for a mammogram, colonoscopy and is up-to-date on her Pap smear which she had in 04/2018 which revealed no intraepithelial lesion or malignancy. She was placed on oxybutynin at her last office visit for urge incontinence however complains that this is still uncontrolled on her current regimen as every time she coughs or sneezes she feels urine leak.   Past Medical History:  Diagnosis Date  . PONV (postoperative nausea and vomiting)     Past Surgical History:  Procedure Laterality Date  . ORIF ANKLE FRACTURE Right 05/03/2016   Procedure: OPEN REDUCTION INTERNAL FIXATION RIGHT (ORIF) ANKLE FRACTURE WEIGHT BEARING DISTAL TIBIA WITH FIXATION TIBIA AND FIBULA;  Surgeon: Renette Butters, MD;  Location: [facility];  Service: Orthopedics;  Laterality: Right;  . Right blocked tear duct      History reviewed. No pertinent family history.  Allergies  Allergen Reactions  . Aspirin Nausea And Vomiting    Outpatient Medications Prior to Visit  Medication Sig Dispense Refill  . oxybutynin (DITROPAN) 5 MG tablet Take 1 tablet (5 mg total) by mouth 2 (two) times daily. 60 tablet 1  . cephALEXin (KEFLEX) 500 MG capsule Take 1 capsule (500 mg total) by mouth 2 (two) times daily. (Patient not taking: Reported on 05/25/2019) 14 capsule 0   No facility-administered medications prior to visit.     ROS Review of Systems  Constitutional: Negative for activity change, appetite change and fatigue.  HENT: Negative for congestion, sinus pressure and sore throat.   Eyes: Negative for visual disturbance.  Respiratory: Negative for cough, chest tightness, shortness of breath and wheezing.   Cardiovascular: Negative for chest pain and palpitations.  Gastrointestinal: Negative for abdominal distention,  abdominal pain and constipation.  Endocrine: Negative for polydipsia.  Genitourinary: Negative for dysuria and frequency.  Musculoskeletal: Negative for arthralgias and back pain.  Skin: Negative for rash.  Neurological: Negative for tremors, light-headedness and numbness.  Hematological: Does not bruise/bleed easily.  Psychiatric/Behavioral: Negative for agitation and behavioral problems.    Objective:  BP 130/78   Pulse 67   Ht 5\' 7"  (1.702 m)   Wt 273 lb (123.8 kg)   SpO2 97%   BMI 42.76 kg/m   BP/Weight 05/25/2019 05/03/2016 2/95/2841  Systolic BP 324 401 027  Diastolic BP 78 67 59  Wt. (Lbs) 273 264 260  BMI 42.76 41.35 40.72      Physical Exam Constitutional:      General: She is not in acute distress.    Appearance: She is well-developed. She is not diaphoretic.  HENT:     Head: Normocephalic.     Right Ear: External ear normal.     Left Ear: External ear normal.     Nose: Nose normal.  Eyes:     Conjunctiva/sclera: Conjunctivae normal.     Pupils: Pupils are equal, round, and reactive to light.  Neck:     Vascular: No JVD.  Cardiovascular:     Rate and Rhythm: Normal rate and regular rhythm.     Heart sounds: Normal heart sounds. No murmur. No gallop.   Pulmonary:     Effort: Pulmonary effort is normal. No respiratory distress.     Breath sounds: Normal breath sounds. No wheezing or rales.  Chest:  Chest wall: No tenderness.     Breasts:        Right: No mass or tenderness.        Left: No mass or tenderness.  Abdominal:     General: Bowel sounds are normal. There is no distension.     Palpations: Abdomen is soft. There is no mass.     Tenderness: There is no abdominal tenderness.  Musculoskeletal:        General: No tenderness. Normal range of motion.     Cervical back: Normal range of motion.  Skin:    General: Skin is warm and dry.  Neurological:     Mental Status: She is alert and oriented to person, place, and time.     Deep Tendon  Reflexes: Reflexes are normal and symmetric.     CMP Latest Ref Rng & Units 04/08/2019  Glucose 65 - 99 mg/dL 89  BUN 8 - 27 mg/dL 8  Creatinine 9.93 - 7.16 mg/dL 9.67  Sodium 893 - 810 mmol/L 145(H)  Potassium 3.5 - 5.2 mmol/L 4.2  Chloride 96 - 106 mmol/L 107(H)  CO2 20 - 29 mmol/L 27  Calcium 8.7 - 10.3 mg/dL 8.8  Total Protein 6.0 - 8.5 g/dL 1.7(P)  Total Bilirubin 0.0 - 1.2 mg/dL <1.0  Alkaline Phos 39 - 117 IU/L 102  AST 0 - 40 IU/L 13  ALT 0 - 32 IU/L 10    Lipid Panel     Component Value Date/Time   CHOL 157 04/08/2019 1051   TRIG 98 04/08/2019 1051   HDL 51 04/08/2019 1051   CHOLHDL 3.1 04/08/2019 1051   LDLCALC 88 04/08/2019 1051    CBC No results found for: WBC, RBC, HGB, HCT, PLT, MCV, MCH, MCHC, RDW, LYMPHSABS, MONOABS, EOSABS, BASOSABS  Lab Results  Component Value Date   HGBA1C 5.5 04/08/2019    Assessment & Plan:  1. Annual physical exam Counseled on 150 minutes of exercise per week, healthy eating (including decreased daily intake of saturated fats, cholesterol, added sugars, sodium), routine healthcare maintenance.   2. Urge incontinence of urine Uncontrolled Increased dose of oxybutynin from twice daily to 3 times daily If symptoms are uncontrolled at next visit we will consider Myrbetriq Counseled on Kegel exercises - oxybutynin (DITROPAN) 5 MG tablet; Take 1 tablet (5 mg total) by mouth 3 (three) times daily.  Dispense: 90 tablet; Refill: 1  3. Encounter for screening mammogram for malignant neoplasm of breast - MM 3D SCREEN BREAST BILATERAL; Future  4. Screening for colon cancer - Ambulatory referral to Gastroenterology  5. Tobacco abuse Spent 3 minutes counseling on smoking cessation and she is not ready to quit at this time.    Meds ordered this encounter  Medications  . oxybutynin (DITROPAN) 5 MG tablet    Sig: Take 1 tablet (5 mg total) by mouth 3 (three) times daily.    Dispense:  90 tablet    Refill:  1    Follow-up:  Return in about 3 months (around 08/24/2019) for medical conditions.       Hoy Register, MD, FAAFP. [facility] [place], [place] [phone]   05/25/2019, 10:24 AM

## 2019-06-01 ENCOUNTER — Other Ambulatory Visit: Payer: Self-pay

## 2019-06-01 DIAGNOSIS — Z1231 Encounter for screening mammogram for malignant neoplasm of breast: Secondary | ICD-10-CM

## 2019-06-03 ENCOUNTER — Ambulatory Visit: Payer: Medicaid Other

## 2019-06-09 ENCOUNTER — Other Ambulatory Visit: Payer: Self-pay

## 2019-06-09 ENCOUNTER — Ambulatory Visit (AMBULATORY_SURGERY_CENTER): Payer: Self-pay | Admitting: *Deleted

## 2019-06-09 VITALS — Temp 97.1°F | Ht 67.0 in | Wt 271.0 lb

## 2019-06-09 DIAGNOSIS — Z1211 Encounter for screening for malignant neoplasm of colon: Secondary | ICD-10-CM

## 2019-06-09 DIAGNOSIS — Z01818 Encounter for other preprocedural examination: Secondary | ICD-10-CM

## 2019-06-09 MED ORDER — SUPREP BOWEL PREP KIT 17.5-3.13-1.6 GM/177ML PO SOLN
1.0000 | Freq: Once | ORAL | 0 refills | Status: AC
Start: 2019-06-09 — End: 2019-06-09

## 2019-06-09 NOTE — Progress Notes (Signed)

## 2019-06-17 ENCOUNTER — Ambulatory Visit: Payer: Medicaid Other

## 2019-06-22 ENCOUNTER — Encounter: Payer: Medicaid Other | Admitting: Gastroenterology

## 2019-07-07 ENCOUNTER — Ambulatory Visit: Payer: Self-pay

## 2019-07-07 ENCOUNTER — Ambulatory Visit: Payer: Medicaid Other

## 2019-07-23 ENCOUNTER — Ambulatory Visit: Payer: Self-pay | Admitting: *Deleted

## 2019-07-23 ENCOUNTER — Ambulatory Visit: Payer: Medicaid Other

## 2019-07-23 ENCOUNTER — Other Ambulatory Visit: Payer: Self-pay | Admitting: *Deleted

## 2019-07-23 ENCOUNTER — Other Ambulatory Visit: Payer: Self-pay

## 2019-07-23 VITALS — BP 128/82 | Temp 97.1°F | Wt 269.3 lb

## 2019-07-23 DIAGNOSIS — Z1231 Encounter for screening mammogram for malignant neoplasm of breast: Secondary | ICD-10-CM

## 2019-07-23 DIAGNOSIS — Z1239 Encounter for other screening for malignant neoplasm of breast: Secondary | ICD-10-CM

## 2019-07-23 NOTE — Patient Instructions (Addendum)
Explained breast self awareness with [name]. Patient did not need a Pap smear today due to last Pap smear was 04/07/2018. Let her know B[facility]CP will cover Pap smears every 3 years unless has a history of abnormal Pap smears. Referred patient to the [facility] for a screening mammogram. Appointment scheduled Thursday, July 30, 2019 at 1530 on the mobile unit. Patient aware of appointment and will be there. Let patient know the Breast Center will follow up with her within the next couple weeks with results of mammogram by letter or phone. Discussed smoking cessation with patient. Referred to the [place] quit line and gave information about the free smoking cessation classes at [facility]. Stephanie Knight verbalized understanding.  Kainan Patty, Kathaleen Maser, RN 3:30 PM

## 2019-07-23 NOTE — Progress Notes (Signed)
[name] is a 59 y.o. female who [facility] today with no complaints.   Pap Smear: Pap not smear completed today. Last Pap smear was 04/07/2018 at the free cervical cancer screening clinic sponsored by the [facility] and was normal. Per patient has no history of an abnormal Pap smear. Last Pap smear result is in Epic.   Physical exam: Breasts Breasts symmetrical. No skin abnormalities bilateral breasts. No nipple retraction bilateral breasts. No nipple discharge bilateral breasts. No lymphadenopathy. No lumps palpated bilateral breasts. No complaints of pain or tenderness on exam.      Pelvic/Bimanual Pap is not indicated today per BCCCP guidelines.    Smoking History: Patient is a current smoker. Discussed smoking cessation with patient. Referred to the [place] quit line and gave information about the free smoking cessation classes at [facility].    Patient Navigation: Patient education provided. Access to services provided for patient through B[facility]CP program.  Colorectal Cancer Screening: Per patient has never had a colonoscopy completed. No complaints today.    Breast and Cervical Cancer Risk Assessment: Patient has no family history of breast cancer, known genetic mutations, or radiation treatment to the chest before age 32. Patient has no history of cervical dysplasia, immunocompromised, or DES exposure in-utero.  Risk Assessment    Risk Scores      07/23/2019   Last edited by: Priscille Heidelberg, RN   5-year risk: 1.5 %   Lifetime risk: 6.7 %         A: BCCCP exam without pap smear  P: Referred patient to the [facility] for a screening mammogram. Appointment scheduled Thursday, July 30, 2019 at 1530 on the mobile unit  Priscille Heidelberg, [place] 07/23/2019 4:08 PM

## 2019-07-30 ENCOUNTER — Inpatient Hospital Stay: Admission: RE | Admit: 2019-07-30 | Payer: Medicaid Other | Source: Ambulatory Visit

## 2019-09-08 ENCOUNTER — Encounter: Payer: Medicaid Other | Admitting: Obstetrics and Gynecology

## 2019-10-07 ENCOUNTER — Ambulatory Visit: Payer: Self-pay | Admitting: Family Medicine

## 2019-10-07 ENCOUNTER — Other Ambulatory Visit: Payer: Self-pay

## 2019-10-07 ENCOUNTER — Ambulatory Visit: Payer: Self-pay | Admitting: Physician Assistant

## 2019-10-07 VITALS — BP 124/70 | HR 72 | Temp 98.7°F | Resp 18 | Ht 67.0 in

## 2019-10-07 DIAGNOSIS — N3001 Acute cystitis with hematuria: Secondary | ICD-10-CM

## 2019-10-07 DIAGNOSIS — R319 Hematuria, unspecified: Secondary | ICD-10-CM

## 2019-10-07 LAB — POCT URINALYSIS DIP (CLINITEK)
Glucose, UA: NEGATIVE mg/dL
Ketones, POC UA: NEGATIVE mg/dL
Nitrite, UA: POSITIVE — AB
POC PROTEIN,UA: 100 — AB
Spec Grav, UA: >=1.03 — AB (ref 1.010–1.025)
Urobilinogen, UA: 2 E.U./dL — AB
pH, UA: 5.5 (ref 5.0–8.0)

## 2019-10-07 MED ORDER — SULFAMETHOXAZOLE-TRIMETHOPRIM 800-160 MG PO TABS: 1.0000 | ORAL_TABLET | Freq: Two times a day (BID) | ORAL | 0 refills | Status: DC

## 2019-10-07 MED FILL — SULFAMETHOXAZOLE-TMP DS TAB: 800-160 | 7 days supply | Qty: 14 | Fill #0

## 2019-10-07 NOTE — Telephone Encounter (Signed)
Pt reports blood in urine, onset 2 days ago. Reports "Water turns a little red, some on paper when wiping." Denies dysuria, no back or flank pain, no fever. Denies clots.Reports is voiding more frequently. Called practice regarding availability, pt referred to mobile clinic. Information provided to pt, verbalizes understanding, state will follow disposition.  Care advise given per protocol, verbalizes understanding.  Reason for Disposition  Blood in urine  (Exception: could be normal menstrual bleeding)  Answer Assessment - Initial Assessment Questions 1. COLOR of URINE: "Describe the color of the urine."  (e.g., tea-colored, pink, red, blood clots, bloody)     Red, no clots 2. ONSET: "When did the bleeding start?"      2 days ago 3. EPISODES: "How many times has there been blood in the urine?" or "How many times today?"     Each void 4. PAIN with URINATION: "Is there any pain with passing your urine?" If Yes, ask: "How bad is the pain?"  (Scale 1-10; or mild, moderate, severe)    - MILD - complains slightly about urination hurting    - MODERATE - interferes with normal activities      - SEVERE - excruciating, unwilling or unable to urinate because of the pain      no 5. FEVER: "Do you have a fever?" If Yes, ask: "What is your temperature, how was it measured, and when did it start?"     no 6. ASSOCIATED SYMPTOMS: "Are you passing urine more frequently than usual?"     No, "I usually go a lot." 7. OTHER SYMPTOMS: "Do you have any other symptoms?" (e.g., back/flank pain, abdominal pain, vomiting)     none  Protocols used: URINE - BLOOD IN-A-AH

## 2019-10-07 NOTE — Progress Notes (Signed)
Established Patient Office Visit  Subjective:  Patient ID: Stephanie Knight, female    DOB: 1960-07-15  Age: 59 y.o. MRN: [id number]  CC:  Chief Complaint  Patient presents with  . Hematuria    HPI Stephanie Knight reports that she started having hematuria 2 days ago, denies dysuria, abdominal pain, lower back pain, fever, nausea or vomiting.  Reports that she was seen in March 2021 by her PCP for urinary frequency, was found to have a UTI at that time, states that she does not remember if she completed the antibiotic regimen, but states that she has continued to have urinary frequency since then.     Past Medical History:  Diagnosis Date  . Anemia    with pregnancy   . PONV (postoperative nausea and vomiting)     Past Surgical History:  Procedure Laterality Date  . ORIF ANKLE FRACTURE Right 05/03/2016   Procedure: OPEN REDUCTION INTERNAL FIXATION RIGHT (ORIF) ANKLE FRACTURE WEIGHT BEARING DISTAL TIBIA WITH FIXATION TIBIA AND FIBULA;  Surgeon: Sheral Apley, MD;  Location: [facility];  Service: Orthopedics;  Laterality: Right;  . Right blocked tear duct      Family History  Problem Relation Age of Onset  . Hypertension Mother   . Dementia Mother   . Hypertension Father   . Hypertension Sister   . Colon cancer Neg Hx   . Colon polyps Neg Hx   . Esophageal cancer Neg Hx   . Rectal cancer Neg Hx   . Stomach cancer Neg Hx     Social History   Socioeconomic History  . Marital status: Single    Spouse name: Not on file  . Number of children: Not on file  . Years of education: Not on file  . Highest education level: Some college, no degree  Occupational History  . Not on file  Tobacco Use  . Smoking status: Current Every Day Smoker    Packs/day: 0.50    Types: Cigarettes  . Smokeless tobacco: Never Used  Substance and Sexual Activity  . Alcohol use: Yes    Comment: Socially  . Drug use: Not Currently    Types: Marijuana    Comment: 10 years  ago  . Sexual activity: Yes    Birth control/protection: None, Condom  Other Topics Concern  . Not on file  Social History Narrative  . Not on file   Social Determinants of Health   Financial Resource Strain:   . Difficulty of Paying Living Expenses: Not on file  Food Insecurity:   . Worried About Programme researcher, broadcasting/film/video in the Last Year: Not on file  . Ran Out of Food in the Last Year: Not on file  Transportation Needs: No Transportation Needs  . Lack of Transportation (Medical): No  . Lack of Transportation (Non-Medical): No  Physical Activity:   . Days of Exercise per Week: Not on file  . Minutes of Exercise per Session: Not on file  Stress:   . Feeling of Stress : Not on file  Social Connections:   . Frequency of Communication with Friends and Family: Not on file  . Frequency of Social Gatherings with Friends and Family: Not on file  . Attends Religious Services: Not on file  . Active Member of Clubs or Organizations: Not on file  . Attends Banker Meetings: Not on file  . Marital Status: Not on file  Intimate Partner Violence:   . Fear of Current or  Ex-Partner: Not on file  . Emotionally Abused: Not on file  . Physically Abused: Not on file  . Sexually Abused: Not on file    Outpatient Medications Prior to Visit  Medication Sig Dispense Refill  . oxybutynin (DITROPAN) 5 MG tablet Take 1 tablet (5 mg total) by mouth 3 (three) times daily. 90 tablet 1  . cephALEXin (KEFLEX) 500 MG capsule Take 1 capsule (500 mg total) by mouth 2 (two) times daily. (Patient not taking: Reported on 05/25/2019) 14 capsule 0   No facility-administered medications prior to visit.    Allergies  Allergen Reactions  . Aspirin Nausea And Vomiting    ROS Review of Systems  Constitutional: Negative for chills and fever.  HENT: Negative.   Eyes: Negative.   Respiratory: Negative.   Cardiovascular: Negative.   Gastrointestinal: Negative for abdominal pain, nausea and rectal  pain.  Endocrine: Negative.   Genitourinary: Positive for frequency and hematuria. Negative for dysuria, pelvic pain, urgency and vaginal discharge.  Musculoskeletal: Negative.   Allergic/Immunologic: Negative.   Neurological: Negative.   Hematological: Negative.   Psychiatric/Behavioral: Negative.       Objective:    Physical Exam Vitals and nursing note reviewed.  Constitutional:      General: She is not in acute distress.    Appearance: Normal appearance. She is not ill-appearing.  HENT:     Head: Normocephalic and atraumatic.     Right Ear: External ear normal.     Left Ear: External ear normal.     Nose: Nose normal.     Mouth/Throat:     Mouth: Mucous membranes are moist.     Pharynx: Oropharynx is clear.  Eyes:     Extraocular Movements: Extraocular movements intact.     Conjunctiva/sclera: Conjunctivae normal.     Pupils: Pupils are equal, round, and reactive to light.  Cardiovascular:     Rate and Rhythm: Normal rate and regular rhythm.     Pulses: Normal pulses.     Heart sounds: Normal heart sounds.  Pulmonary:     Effort: Pulmonary effort is normal.     Breath sounds: Normal breath sounds.  Abdominal:     General: Abdomen is flat. Bowel sounds are normal.  Musculoskeletal:        General: Normal range of motion.     Cervical back: Normal range of motion and neck supple.  Skin:    General: Skin is warm and dry.  Neurological:     General: No focal deficit present.     Mental Status: She is alert and oriented to person, place, and time.  Psychiatric:        Mood and Affect: Mood normal.        Behavior: Behavior normal.        Thought Content: Thought content normal.        Judgment: Judgment normal.     BP 124/70 (BP Location: Left Arm, Patient Position: Sitting, Cuff Size: Large)   Pulse 72   Temp 98.7 F (37.1 C) (Oral)   Resp 18   Ht 5\' 7"  (1.702 m)   SpO2 98%   BMI 42.18 kg/m  Wt Readings from Last 3 Encounters:  07/23/19 269 lb 4.8 oz  (122.2 kg)  06/09/19 271 lb (122.9 kg)  05/25/19 273 lb (123.8 kg)     Health Maintenance Due  Topic Date Due  . COVID-19 Vaccine (1) Never done  . TETANUS/TDAP  Never done  . MAMMOGRAM  Never done  .  COLONOSCOPY  Never done  . INFLUENZA VACCINE  09/13/2019    There are no preventive care reminders to display for this patient.  No results found for: TSH No results found for: WBC, HGB, HCT, MCV, PLT Lab Results  Component Value Date   NA 145 (H) 04/08/2019   K 4.2 04/08/2019   CO2 27 04/08/2019   GLUCOSE 89 04/08/2019   BUN 8 04/08/2019   CREATININE 0.82 04/08/2019   BILITOT <0.2 04/08/2019   ALKPHOS 102 04/08/2019   AST 13 04/08/2019   ALT 10 04/08/2019   PROT 5.8 (L) 04/08/2019   ALBUMIN 3.7 (L) 04/08/2019   CALCIUM 8.8 04/08/2019   Lab Results  Component Value Date   CHOL 157 04/08/2019   Lab Results  Component Value Date   HDL 51 04/08/2019   Lab Results  Component Value Date   LDLCALC 88 04/08/2019   Lab Results  Component Value Date   TRIG 98 04/08/2019   Lab Results  Component Value Date   CHOLHDL 3.1 04/08/2019   Lab Results  Component Value Date   HGBA1C 5.5 04/08/2019      Assessment & Plan:   Problem List Items Addressed This Visit    None    Visit Diagnoses    Hematuria, unspecified type    -  Primary   Relevant Orders   POCT URINALYSIS DIP (CLINITEK) (Completed)   Urine Culture   Acute cystitis with hematuria       Relevant Medications   sulfamethoxazole-trimethoprim (BACTRIM DS) 800-160 MG tablet    1. Hematuria, unspecified type Urine culture from April 2021 was positive for E. Coli, was susceptible to Bactrim, patient education given on increasing hydration, red flags for prompt evaluation at emergency department - POCT URINALYSIS DIP (CLINITEK) - Urine Culture  2. Acute cystitis with hematuria  - sulfamethoxazole-trimethoprim (BACTRIM DS) 800-160 MG tablet; Take 1 tablet by mouth 2 (two) times daily.  Dispense: 14  tablet; Refill: 0   I have reviewed the patient's medical history (PMH, PSH, Social History, Family History, Medications, and allergies) , and have been updated if relevant. I spent 20 minutes reviewing chart and  face to face time with patient.    Meds ordered this encounter  Medications  . sulfamethoxazole-trimethoprim (BACTRIM DS) 800-160 MG tablet    Sig: Take 1 tablet by mouth 2 (two) times daily.    Dispense:  14 tablet    Refill:  0    Order Specific Question:   Supervising Provider    Answer:   Storm Frisk [[id number]]    Follow-up: Return if symptoms worsen or fail to improve.    Kasandra Knudsen Mayers, PA-C

## 2019-10-07 NOTE — Progress Notes (Signed)
Patient complains of blood in the toilet after urinating the past two nights. Patient denies pain during or after urination. Patient denies abdominal or back pain. Patient has never experienced this prior. Patient has been Post Menopausal for years.

## 2019-10-07 NOTE — Patient Instructions (Addendum)
Please take Bactrim twice a day for 7 days, lots and lots of water!  Please let us know if you need anything else please let us know  Roney Jaffe, PA-C Physician Assistant [facility] [email or web address]    Pyelonephritis, Adult Pyelonephritis is an infection that occurs in the kidney. The kidneys are the organs that filter a person's blood and move waste out of the bloodstream and into the urine. Urine passes from the kidneys, through tubes called ureters, and into the bladder. There are two main types of pyelonephritis:  Infections that come on quickly without any warning (acute pyelonephritis).  Infections that last for a long period of time (chronic pyelonephritis). In most cases, the infection clears up with treatment and does not cause further problems. More severe infections or chronic infections can sometimes spread to the bloodstream or lead to other problems with the kidneys. What are the causes? This condition is usually caused by:  Bacteria traveling from the bladder up to the kidney. This may occur after having a bladder infection (cystitis) or urinary tract infection (UTI).  Bladder infections caused from bacteria traveling from the bloodstream to the kidney. What increases the risk? This condition is more likely to develop in:  Pregnant women.  Older people.  People who have any of these conditions: ? Diabetes. ? Inflammation of the prostate gland (prostatitis), in males. ? Kidney stones or bladder stones. ? Other abnormalities of the kidney or ureter. ? Cancer.  People who have a catheter placed in the bladder.  People who are sexually active.  Women who use spermicides.  People who have had a prior UTI. What are the signs or symptoms? Symptoms of this condition include:  Frequent urination.  Strong or persistent urge to urinate.  Burning or stinging when urinating.  Abdominal pain.  Back  pain.  Pain in the side or flank area.  Fever or chills.  Blood in the urine, or dark urine.  Nausea or vomiting. How is this diagnosed? This condition may be diagnosed based on:  Your medical history and a physical exam.  Urine tests.  Blood tests. You may also have imaging tests of the kidneys, such as an ultrasound or CT scan. How is this treated? Treatment for this condition may depend on the severity of the infection.  If the infection is mild and is found early, you may be treated with antibiotic medicines taken by mouth (orally). You will need to drink fluids to remain hydrated.  If the infection is more severe, you may need to stay in the hospital and receive antibiotics given directly into a vein through an IV. You may also need to receive fluids through an IV if you are not able to remain hydrated. After your hospital stay, you may need to take oral antibiotics for a period of time. Other treatments may be required, depending on the cause of the infection. Follow these instructions at home: Medicines  Take your antibiotic medicine as told by your health care provider. Do not stop taking the antibiotic even if you start to feel better.  Take over-the-counter and prescription medicines only as told by your health care provider. General instructions   Drink enough fluid to keep your urine pale yellow.  Avoid caffeine, tea, and carbonated beverages. They tend to irritate the bladder.  Urinate often. Avoid holding in urine for long periods of time.  Urinate before and after sex.  After a bowel movement, women should cleanse from front to back.  Use each tissue only once.  Keep all follow-up visits as told by your health care provider. This is important. Contact a health care provider if:  Your symptoms do not get better after 2 days of treatment.  Your symptoms get worse.  You have a fever. Get help right away if you:  Are unable to take your antibiotics or  fluids.  Have shaking chills.  Vomit.  Have severe flank or back pain.  Have extreme weakness or fainting. Summary  Pyelonephritis is a urinary tract infection (UTI) that occurs in the kidney.  Treatment for this condition may depend on the severity of the infection.  Take your antibiotic medicine as told by your health care provider. Do not stop taking the antibiotic even if you start to feel better.  Drink enough fluid to keep your urine pale yellow.  Keep all follow-up visits as told by your health care provider. This is important. This information is not intended to replace advice given to you by your health care provider. Make sure you discuss any questions you have with your health care provider. Document Revised: 12/03/2017 Document Reviewed: 12/03/2017 Elsevier Patient Education  2020 [employer].

## 2019-10-13 LAB — URINE CULTURE

## 2019-10-15 ENCOUNTER — Encounter: Payer: Self-pay | Admitting: *Deleted

## 2019-10-15 NOTE — Progress Notes (Signed)
Patient verified DOB Patient is aware of e coli being present. Patient has completed antibiotics. Patient denies pain or hematuria at this time No further concerns

## 2019-10-16 ENCOUNTER — Telehealth: Payer: Self-pay | Admitting: Acute Care

## 2019-10-16 NOTE — Telephone Encounter (Signed)
LMTC x 1 - Need to discuss lung cancer screening grant.

## 2019-10-30 NOTE — Telephone Encounter (Signed)
LMTC x 1  

## 2019-10-30 NOTE — Telephone Encounter (Signed)
Spoke with pt and scheduled SDMV through grant 11/30/19 5:20. Pt verbalized understanding. Nothing further needed.

## 2019-11-30 ENCOUNTER — Other Ambulatory Visit: Payer: Self-pay | Admitting: *Deleted

## 2019-11-30 ENCOUNTER — Other Ambulatory Visit: Payer: Self-pay | Admitting: Acute Care

## 2019-11-30 ENCOUNTER — Other Ambulatory Visit: Payer: Self-pay

## 2019-11-30 ENCOUNTER — Encounter: Payer: Self-pay | Admitting: Acute Care

## 2019-11-30 DIAGNOSIS — F17210 Nicotine dependence, cigarettes, uncomplicated: Secondary | ICD-10-CM

## 2019-11-30 DIAGNOSIS — Z122 Encounter for screening for malignant neoplasm of respiratory organs: Secondary | ICD-10-CM

## 2019-11-30 NOTE — Patient Instructions (Signed)
Thank you for participating in the [facility] Screening Program. It was our pleasure to meet you today. We will call you with the results of your scan within the next few days. Your scan will be assigned a Lung RADS category score by the physicians reading the scans.  This Lung RADS score determines follow up scanning.  See below for description of categories, and follow up screening recommendations. We will be in touch to schedule your follow up screening annually or based on recommendations of our providers. We will fax a copy of your scan results to your Primary Care Physician, or the physician who referred you to the program, to ensure they have the results. Please call the office if you have any questions or concerns regarding your scanning experience or results.  Our office number is [phone]. Please speak with Denise Phelps, RN. She is our Lung Cancer Screening RN. If she is unavailable when you call, please have the office staff send her a message. She will return your call at her earliest convenience. Remember, if your scan is normal, we will scan you annually as long as you continue to meet the criteria for the program. (Age 55-77, Current smoker or smoker who has quit within the last 15 years). If you are a smoker, remember, quitting is the single most powerful action that you can take to decrease your risk of lung cancer and other pulmonary, breathing related problems. We know quitting is hard, and we are here to help.  Please let us know if there is anything we can do to help you meet your goal of quitting. If you are a former smoker, congratulations. We are proud of you! Remain smoke free! Remember you can refer friends or family members through the number above.  We will screen them to make sure they meet criteria for the program. Thank you for helping us take better care of you by participating in Lung Screening.  Lung RADS Categories:  Lung RADS 1: no nodules  or definitely non-concerning nodules.  Recommendation is for a repeat annual scan in 12 months.  Lung RADS 2:  nodules that are non-concerning in appearance and behavior with a very low likelihood of becoming an active cancer. Recommendation is for a repeat annual scan in 12 months.  Lung RADS 3: nodules that are probably non-concerning , includes nodules with a low likelihood of becoming an active cancer.  Recommendation is for a 6-month repeat screening scan. Often noted after an upper respiratory illness. We will be in touch to make sure you have no questions, and to schedule your 6-month scan.  Lung RADS 4 A: nodules with concerning findings, recommendation is most often for a follow up scan in 3 months or additional testing based on our provider's assessment of the scan. We will be in touch to make sure you have no questions and to schedule the recommended 3 month follow up scan.  Lung RADS 4 B:  indicates findings that are concerning. We will be in touch with you to schedule additional diagnostic testing based on our provider's  assessment of the scan.   

## 2019-11-30 NOTE — Progress Notes (Signed)
Shared Decision-Making Visit for Lung Cancer Screening [phone] )  Lung Cancer Screening Criteria 58-81-years of age 61+ pack year smoking history No Recent History of coughing up blood   No Unexplained weight loss of > 15 pounds in the last 6 months. No Prior History Lung / other cancer  (Diagnosis within the last 5 years already requiring surveillance chest CT Scans). Pt is a current smoker, or former smoker who has quit within the last 15 years.  This patient meets the criterial noted above and is asymptomatic for any signs or symptoms of lung cancer.  The Shared Decision-Making Visit discussion included risks and benefits of screening, potential for follow up diagnostic testing for abnormal scans, potential for false positive tests, over diagnosis, and discussion about total radiation exposure. Patient stated willingness to undergo diagnostics and treatment as needed. Current smokers were counseled on smoking cessation as the single most powerful action they can take to decrease their risk of lung cancer, pulmonary disease, heart disease and stroke. They were given a resource card with information on receiving free nicotine replacement therapy , and information about free smoking cessation classes.  Pt understands that this scan is being paid for by a grant obtained by the [facility]. We discussed  that there is no guarantee of additional grant money from year to year as these grants are not guaranteed to programs on an annual basis.They have to be applied for and they are awarded based on county need, and utilization of the previous years funds..   Pt. Verbalized understanding of above, and that she will be receiving a letter in the mail with the ticket for the free scan and ticket to ride the scanner. She understands that she will need to bring both for the free scan.   Current Every day smoker 30.8 pack year smoking history No hemoptysis No unexplained weight  loss. Ticket number 4 I have spent 3 minutes counseling patient on smoking cessation this visit. Patient verbalizes understanding of their  choice to continue smoking and the negative health consequences including worsening of COPD, risk of lung cancer , stroke and heart disease.Bevelyn Ngo, MSN, AGACNP-BC [facility] See Amion for personal pager PCCM on call pager [phone] 11/30/2019 5:05 PM

## 2019-12-18 ENCOUNTER — Ambulatory Visit
Admission: RE | Admit: 2019-12-18 | Discharge: 2019-12-18 | Disposition: A | Discharge status: Home / Self Care | Payer: No Typology Code available for payment source | Source: Ambulatory Visit | Attending: Acute Care | Admitting: Acute Care

## 2019-12-18 DIAGNOSIS — F17210 Nicotine dependence, cigarettes, uncomplicated: Secondary | ICD-10-CM

## 2019-12-22 ENCOUNTER — Ambulatory Visit: Payer: Medicaid Other

## 2019-12-24 NOTE — Progress Notes (Signed)
Please call patient and let them  know their  low dose Ct was read as a Lung RADS 2: nodules that are benign in appearance and behavior with a very low likelihood of becoming a clinically active cancer due to size or lack of growth. Recommendation per radiology is for a repeat LDCT in 12 months. .Please let them  know we will order and schedule their  annual screening scan for 12/2020. Please let them  know there was notation of CAD on their  scan.  Please remind the patient  that this is a non-gated exam therefore degree or severity of disease  cannot be determined. Please have them  follow up with their PCP regarding potential risk factor modification, dietary therapy or pharmacologic therapy if clinically indicated. Pt.  is not  currently on statin therapy. Please place order for annual  screening scan for  12/2020 and fax results to PCP. Thanks so much. 

## 2020-12-13 ENCOUNTER — Ambulatory Visit: Payer: Self-pay | Admitting: *Deleted

## 2020-12-13 NOTE — Telephone Encounter (Signed)
Pt was called and set a nurse visit to drop of urine sample.

## 2020-12-13 NOTE — Telephone Encounter (Signed)
Reason for Disposition  Blood in urine  (Exception: could be normal menstrual bleeding)  Answer Assessment - Initial Assessment Questions 1. COLOR of URINE: "Describe the color of the urine."  (e.g., tea-colored, pink, red, blood clots, bloody)     Just pink colored urine 2. ONSET: "When did the bleeding start?"      Last week 3. EPISODES: "How many times has there been blood in the urine?" or "How many times today?"     2 times 4. PAIN with URINATION: "Is there any pain with passing your urine?" If Yes, ask: "How bad is the pain?"  (Scale 1-10; or mild, moderate, severe)    - MILD - complains slightly about urination hurting    - MODERATE - interferes with normal activities      - SEVERE - excruciating, unwilling or unable to urinate because of the pain      No 5. FEVER: "Do you have a fever?" If Yes, ask: "What is your temperature, how was it measured, and when did it start?"     no 6. ASSOCIATED SYMPTOMS: "Are you passing urine more frequently than usual?"     no 7. OTHER SYMPTOMS: "Do you have any other symptoms?" (e.g., back/flank pain, abdominal pain, vomiting)     no 8. PREGNANCY: "Is there any chance you are pregnant?" "When was your last menstrual period?"     no  Protocols used: Urine - Blood In-A-AH

## 2020-12-13 NOTE — Telephone Encounter (Signed)
Pt reports blood in urine "Pinkish" no clots. Denies any associated symptoms; no dysuria, no frequency or urgency, denies back/flank pain. Afebrile. States "This has happened before and I didn't have any of those things." Noted last week, no worsening. No blood thinners. Pt questioning if she could bring in specimen vs appt. Advised UC, states she would like to hear from practice first regarding request. Care advise given per protocol. Assured pt NT would route to practice for PCPs review and final disposition. Pt verbalizes understanding.  Please advise: [phone]

## 2020-12-14 ENCOUNTER — Ambulatory Visit: Payer: Medicaid Other

## 2020-12-15 ENCOUNTER — Telehealth: Payer: Self-pay

## 2020-12-15 ENCOUNTER — Other Ambulatory Visit: Payer: Self-pay | Admitting: Family Medicine

## 2020-12-15 LAB — URINALYSIS, COMPLETE
Bilirubin, UA: NEGATIVE
Glucose, UA: NEGATIVE
Ketones, UA: NEGATIVE
Nitrite, UA: POSITIVE — AB
Specific Gravity, UA: 1.026 (ref 1.005–1.030)
Urobilinogen, Ur: 1 mg/dL (ref 0.2–1.0)
pH, UA: 5.5 (ref 5.0–7.5)

## 2020-12-15 LAB — MICROSCOPIC EXAMINATION
Casts: NONE SEEN /lpf
RBC, Urine: >30 /hpf — AB (ref 0–2)
WBC, UA: >30 /hpf — AB (ref 0–5)

## 2020-12-15 MED ORDER — NITROFURANTOIN MONOHYD MACRO 100 MG PO CAPS: 100.0000 mg | ORAL_CAPSULE | Freq: Two times a day (BID) | ORAL | 0 refills | Status: DC

## 2020-12-15 NOTE — Telephone Encounter (Signed)
I already sent her results through MyChart.

## 2020-12-15 NOTE — Telephone Encounter (Signed)
Pt dropped off urine sample due to her stating she has blood in her urine.  Pt states that this happens all the time and she is requesting medications.  Urine sample was sent off to lab and results are in chart.

## 2020-12-15 NOTE — Telephone Encounter (Signed)
Pt was called and informed of lab results and medication being sent to pharmacy. 

## 2020-12-15 NOTE — Telephone Encounter (Signed)
Wants to discuss results with nurse, please advise  Best contact: [phone]

## 2020-12-15 NOTE — Progress Notes (Signed)
Pt dropped off urine sample for testing  Results will be routed to PCP.

## 2020-12-19 NOTE — Telephone Encounter (Signed)
Pt states that she is taking the medication and has 1 day left and she is still seeing blood in her urine.

## 2020-12-19 NOTE — Telephone Encounter (Signed)
She needs a visit.

## 2020-12-19 NOTE — Telephone Encounter (Signed)
Copied from CRM [id number]. Topic: General - Other >> Dec 19, 2020  8:25 AM Stephanie Knight wrote: Reason for CRM: Patient called in to inform Dr Alvis Lemmings that she have been taking the medication prescribed and still no change in symptoms. Please call patient for further inform ation Ph# [phone]

## 2020-12-20 NOTE — Telephone Encounter (Signed)
Pt was called to set an appointment. Pt states that she does not see blood in her urine at this time and she will call back at a later time and schedule an appointment.

## 2021-06-02 ENCOUNTER — Other Ambulatory Visit: Payer: Self-pay | Admitting: Physician Assistant

## 2021-06-02 DIAGNOSIS — Z1231 Encounter for screening mammogram for malignant neoplasm of breast: Secondary | ICD-10-CM

## 2021-06-02 DIAGNOSIS — Z780 Asymptomatic menopausal state: Secondary | ICD-10-CM

## 2021-06-29 ENCOUNTER — Encounter: Payer: Self-pay | Admitting: Obstetrics and Gynecology

## 2021-06-29 ENCOUNTER — Ambulatory Visit (INDEPENDENT_AMBULATORY_CARE_PROVIDER_SITE_OTHER): Payer: 59 | Admitting: Obstetrics and Gynecology

## 2021-06-29 ENCOUNTER — Other Ambulatory Visit (HOSPITAL_COMMUNITY)
Admission: RE | Admit: 2021-06-29 | Discharge: 2021-06-29 | Disposition: A | Discharge status: Home / Self Care | Payer: 59 | Source: Ambulatory Visit | Attending: Obstetrics and Gynecology | Admitting: Obstetrics and Gynecology

## 2021-06-29 VITALS — BP 133/81 | HR 69 | Ht 67.0 in | Wt 292.0 lb

## 2021-06-29 DIAGNOSIS — Z01411 Encounter for gynecological examination (general) (routine) with abnormal findings: Secondary | ICD-10-CM | POA: Diagnosis not present

## 2021-06-29 DIAGNOSIS — Z01419 Encounter for gynecological examination (general) (routine) without abnormal findings: Secondary | ICD-10-CM

## 2021-06-29 DIAGNOSIS — Z1231 Encounter for screening mammogram for malignant neoplasm of breast: Secondary | ICD-10-CM | POA: Insufficient documentation

## 2021-06-29 DIAGNOSIS — Z1211 Encounter for screening for malignant neoplasm of colon: Secondary | ICD-10-CM | POA: Insufficient documentation

## 2021-06-29 DIAGNOSIS — N3941 Urge incontinence: Secondary | ICD-10-CM

## 2021-06-29 NOTE — Progress Notes (Signed)
New pt in office to discuss/eval fibroids. Pt states she has uncontrollable urge to urinate.  Pt was evaluated by PCP for urinary infection.   Pt states she has had u/s with PCP(Bethany). Pt states her symptoms have worsened over the last year.  Pt has some urinary incontinence. Pt had tried medication with no relief.   Pt needs mammo.

## 2021-06-29 NOTE — Patient Instructions (Signed)

## 2021-06-30 NOTE — Progress Notes (Signed)
Stephanie Knight is a 61 y.o. G90P0 female here for a routine annual gynecologic exam.  Current complaints: urinary incont.  For several years. Wears adult diapers.  Has tried several medications for her urinary incont without relief. H/O uterine fibroids.  Denies abnormal vaginal bleeding, discharge, pelvic pain, not sexual active.    Gynecologic History No LMP recorded. Patient is postmenopausal. Contraception: post menopausal status Last Pap: uncertain. Results were: normal Last mammogram: Uncertain. Results were: normal  Obstetric History OB History  Gravida Para Term Preterm AB Living  4            SAB IAB Ectopic Multiple Live Births          2    # Outcome Date GA Lbr Len/2nd Weight Sex Delivery Anes PTL Lv  4 Gravida           3 Gravida           2 Gravida           1 [place]             Past Medical History:  Diagnosis Date   Anemia    with pregnancy    PONV (postoperative nausea and vomiting)     Past Surgical History:  Procedure Laterality Date   ORIF ANKLE FRACTURE Right 05/03/2016   Procedure: OPEN REDUCTION INTERNAL FIXATION RIGHT (ORIF) ANKLE FRACTURE WEIGHT BEARING DISTAL TIBIA WITH FIXATION TIBIA AND FIBULA;  Surgeon: Sheral Apley, MD;  Location: [facility];  Service: Orthopedics;  Laterality: Right;   Right blocked tear duct      No current outpatient medications on file prior to visit.   No current facility-administered medications on file prior to visit.    Allergies  Allergen Reactions   Aspirin Nausea And Vomiting    Social History   Socioeconomic History   Marital status: Single    Spouse name: Not on file   Number of children: Not on file   Years of education: Not on file   Highest education level: Some college, no degree  Occupational History   Not on file  Tobacco Use   Smoking status: Every Day    Packs/day: 0.50    Years: 44.00    Pack years: 22.00    Types: Cigarettes   Smokeless tobacco: Never  Substance  and Sexual Activity   Alcohol use: Yes    Comment: Socially   Drug use: Not Currently    Types: Marijuana    Comment: 10 years ago   Sexual activity: Yes    Birth control/protection: None, Condom  Other Topics Concern   Not on file  Social History Narrative   Not on file   Social Determinants of Health   Financial Resource Strain: Not on file  Food Insecurity: Not on file  Transportation Needs: Not on file  Physical Activity: Not on file  Stress: Not on file  Social Connections: Not on file  Intimate Partner Violence: Not on file    Family History  Problem Relation Age of Onset   Hypertension Mother    Dementia Mother    Hypertension Father    Hypertension Sister    Colon cancer Neg Hx    Colon polyps Neg Hx    Esophageal cancer Neg Hx    Rectal cancer Neg Hx    Stomach cancer Neg Hx     The following portions of the patient's history were reviewed and updated as appropriate: allergies, current medications, past family  history, past medical history, past social history, past surgical history and problem list.  Review of Systems Pertinent items noted in HPI and remainder of comprehensive ROS otherwise negative.   Objective:  BP 133/81   Pulse 69   Ht 5\' 7"  (1.702 m)   Wt 132.5 kg   BMI 45.73 kg/m  CONSTITUTIONAL: Well-developed, well-nourished female in no acute distress.  HENT:  Normocephalic, atraumatic, External right and left ear normal. Oropharynx is clear and moist EYES: Conjunctivae and EOM are normal. Pupils are equal, round, and reactive to light. No scleral icterus.  NECK: Normal range of motion, supple, no masses.  Normal thyroid.  SKIN: Skin is warm and dry. No rash noted. Not diaphoretic. No erythema. No pallor. NEUROLGIC: Alert and oriented to person, place, and time. Normal reflexes, muscle tone coordination. No cranial nerve deficit noted. PSYCHIATRIC: Normal mood and affect. Normal behavior. Normal judgment and thought content. CARDIOVASCULAR:  Normal heart rate noted, regular rhythm RESPIRATORY: Clear to auscultation bilaterally. Effort and breath sounds normal, no problems with respiration noted. BREASTS: Symmetric in size. No masses, skin changes, nipple drainage, or lymphadenopathy. ABDOMEN: Soft, normal bowel sounds, no distention noted.  No tenderness, rebound or guarding.  PELVIC: Normal appearing external genitalia; normal appearing vaginal mucosa and cervix.  No abnormal discharge noted.  Pap smear obtained.  Enlarged uterine size, no other palpable masses, no uterine or adnexal tenderness. Bimanual exam limited by pt habitus MUSCULOSKELETAL: Normal range of motion. No tenderness.  No cyanosis, clubbing, or edema.  2+ distal pulses.   Assessment:  Annual gynecologic examination with pap smear Urinary incont Screening mammogram Cologard Plan:  Will follow up results of pap smear and manage accordingly. Mammogram scheduled. Discussed urinary incont. Pt declines PT and medications. Desires referral to UroGyn. Referral placed Routine preventative health maintenance measures emphasized. Please refer to After Visit Summary for other counseling recommendations.    , MD, FACOG Attending Obstetrician & Gynecologist Center for [facility], [facility]

## 2021-07-03 LAB — CYTOLOGY - PAP
Adequacy: ABSENT
Comment: NEGATIVE
Diagnosis: NEGATIVE
High risk HPV: NEGATIVE

## 2021-07-11 ENCOUNTER — Inpatient Hospital Stay (HOSPITAL_BASED_OUTPATIENT_CLINIC_OR_DEPARTMENT_OTHER): Admission: RE | Admit: 2021-07-11 | Payer: 59 | Source: Ambulatory Visit

## 2021-07-14 ENCOUNTER — Inpatient Hospital Stay (HOSPITAL_BASED_OUTPATIENT_CLINIC_OR_DEPARTMENT_OTHER): Admission: RE | Admit: 2021-07-14 | Payer: 59 | Source: Ambulatory Visit | Admitting: Radiology

## 2021-07-27 ENCOUNTER — Ambulatory Visit (HOSPITAL_BASED_OUTPATIENT_CLINIC_OR_DEPARTMENT_OTHER)
Admission: RE | Admit: 2021-07-27 | Discharge: 2021-07-27 | Disposition: A | Discharge status: Home / Self Care | Payer: 59 | Source: Ambulatory Visit | Attending: Physician Assistant | Admitting: Physician Assistant

## 2021-07-27 DIAGNOSIS — Z780 Asymptomatic menopausal state: Secondary | ICD-10-CM | POA: Insufficient documentation

## 2021-07-28 ENCOUNTER — Ambulatory Visit (HOSPITAL_BASED_OUTPATIENT_CLINIC_OR_DEPARTMENT_OTHER)
Admission: RE | Admit: 2021-07-28 | Discharge: 2021-07-28 | Disposition: A | Discharge status: Home / Self Care | Payer: 59 | Source: Ambulatory Visit | Attending: Physician Assistant | Admitting: Physician Assistant

## 2021-07-28 DIAGNOSIS — Z1231 Encounter for screening mammogram for malignant neoplasm of breast: Secondary | ICD-10-CM | POA: Insufficient documentation

## 2021-09-22 LAB — COLOGUARD: COLOGUARD: NEGATIVE

## 2021-11-14 ENCOUNTER — Encounter (HOSPITAL_COMMUNITY): Payer: Self-pay | Admitting: Emergency Medicine

## 2021-11-14 ENCOUNTER — Emergency Department (HOSPITAL_COMMUNITY): Payer: No Typology Code available for payment source

## 2021-11-14 ENCOUNTER — Emergency Department (HOSPITAL_COMMUNITY)
Admission: EM | Admit: 2021-11-14 | Discharge: 2021-11-14 | Disposition: A | Discharge status: Home / Self Care | Payer: No Typology Code available for payment source | Attending: Emergency Medicine | Admitting: Emergency Medicine

## 2021-11-14 ENCOUNTER — Other Ambulatory Visit: Payer: Self-pay

## 2021-11-14 DIAGNOSIS — R7309 Other abnormal glucose: Secondary | ICD-10-CM | POA: Diagnosis not present

## 2021-11-14 DIAGNOSIS — N3000 Acute cystitis without hematuria: Secondary | ICD-10-CM | POA: Diagnosis not present

## 2021-11-14 DIAGNOSIS — R42 Dizziness and giddiness: Secondary | ICD-10-CM | POA: Diagnosis present

## 2021-11-14 LAB — BASIC METABOLIC PANEL
Anion gap: 8 (ref 5–15)
BUN: 9 mg/dL (ref 8–23)
CO2: 31 mmol/L (ref 22–32)
Calcium: 8.9 mg/dL (ref 8.9–10.3)
Chloride: 105 mmol/L (ref 98–111)
Creatinine, Ser: 0.93 mg/dL (ref 0.44–1.00)
GFR, Estimated: >60 mL/min (ref >60)
Glucose, Bld: 113 mg/dL — ABNORMAL HIGH (ref 70–99)
Potassium: 4.3 mmol/L (ref 3.5–5.1)
Sodium: 144 mmol/L (ref 135–145)

## 2021-11-14 LAB — URINALYSIS, ROUTINE W REFLEX MICROSCOPIC
Bilirubin Urine: NEGATIVE
Glucose, UA: NEGATIVE mg/dL
Ketones, ur: NEGATIVE mg/dL
Nitrite: POSITIVE — AB
Protein, ur: NEGATIVE mg/dL
Specific Gravity, Urine: 1.016 (ref 1.005–1.030)
pH: 6 (ref 5.0–8.0)

## 2021-11-14 LAB — CBC
HCT: 43.9 % (ref 36.0–46.0)
Hemoglobin: 13.6 g/dL (ref 12.0–15.0)
MCH: 29.2 pg (ref 26.0–34.0)
MCHC: 31 g/dL (ref 30.0–36.0)
MCV: 94.2 fL (ref 80.0–100.0)
Platelets: 355 10*3/uL (ref 150–400)
RBC: 4.66 MIL/uL (ref 3.87–5.11)
RDW: 15.9 % — ABNORMAL HIGH (ref 11.5–15.5)
WBC: 8.4 10*3/uL (ref 4.0–10.5)
nRBC: 0 % (ref 0.0–0.2)

## 2021-11-14 LAB — CBG MONITORING, ED: Glucose-Capillary: 103 mg/dL — ABNORMAL HIGH (ref 70–99)

## 2021-11-14 LAB — TROPONIN I (HIGH SENSITIVITY): Troponin I (High Sensitivity): 5 ng/L (ref <18)

## 2021-11-14 MED ORDER — SULFAMETHOXAZOLE-TRIMETHOPRIM 800-160 MG PO TABS: 1.0000 | ORAL_TABLET | Freq: Two times a day (BID) | ORAL | 0 refills | Status: AC

## 2021-11-14 MED ORDER — SODIUM CHLORIDE 0.9 % IV BOLUS
1000.0000 mL | Freq: Once | INTRAVENOUS | Status: AC
  Administered 2021-11-14: 1000 mL via INTRAVENOUS

## 2021-11-14 NOTE — ED Notes (Signed)
Patient transported to CT 

## 2021-11-14 NOTE — ED Provider Notes (Signed)
[facility] EMERGENCY DEPARTMENT Provider Note   CSN: [id number] Arrival date & time: 11/14/21  [id number]     History {Add pertinent medical, surgical, social history, OB history to HPI:1} Chief Complaint  Patient presents with   Dizziness    Stephanie Knight is a 61 y.o. female.  Patient complains of having some dizziness today.  She states she has some incontinence problems with no other medical problems   Dizziness      Home Medications Prior to Admission medications   Medication Sig Start Date End Date Taking? Authorizing Provider  Naproxen Sodium (ALEVE) 220 MG CAPS Take 2 capsules by mouth daily as needed (pain).   Yes [provider]  sulfamethoxazole-trimethoprim (BACTRIM DS) 800-160 MG tablet Take 1 tablet by mouth 2 (two) times daily for 7 days. 11/14/21 11/21/21 Yes Milton Ferguson, MD      Allergies    Aspirin    Review of Systems   Review of Systems  Neurological:  Positive for dizziness.    Physical Exam Updated Vital Signs BP (!) 131/90   Pulse (!) 57   Temp 98.7 F (37.1 C) (Oral)   Resp (!) 21   Ht 5\' 7"  (1.702 m)   Wt 136.1 kg   SpO2 95%   BMI 46.99 kg/m  Physical Exam  ED Results / Procedures / Treatments   Labs (all labs ordered are listed, but only abnormal results are displayed) Labs Reviewed  BASIC METABOLIC PANEL - Abnormal; Notable for the following components:      Result Value   Glucose, Bld 113 (*)    All other components within normal limits  CBC - Abnormal; Notable for the following components:   RDW 15.9 (*)    All other components within normal limits  URINALYSIS, ROUTINE W REFLEX MICROSCOPIC - Abnormal; Notable for the following components:   APPearance CLOUDY (*)    Hgb urine dipstick SMALL (*)    Nitrite POSITIVE (*)    Leukocytes,Ua MODERATE (*)    Bacteria, UA MANY (*)    All other components within normal limits  CBG MONITORING, ED - Abnormal; Notable for the following components:    Glucose-Capillary 103 (*)    All other components within normal limits  URINE CULTURE  TROPONIN I (HIGH SENSITIVITY)    EKG EKG Interpretation  Date/Time:  Tuesday November 14 2021 06:45:24 EDT Ventricular Rate:  60 PR Interval:  150 QRS Duration: 74 QT Interval:  418 QTC Calculation: 418 R Axis:   68 Text Interpretation: Normal sinus rhythm Normal ECG No previous ECGs available Confirmed by Milton Ferguson [phone]) on 11/14/2021 2:26:04 PM  Radiology CT Head Wo Contrast  Result Date: 11/14/2021 CLINICAL DATA:  Provided history: Dizziness, persistent/recurrent, cardiac or vascular cause suspected. Additional history provided: Patient reports dizziness with movement upon waking at 5:30 a.m. Nausea. EXAM: CT HEAD WITHOUT CONTRAST TECHNIQUE: Contiguous axial images were obtained from the base of the skull through the vertex without intravenous contrast. RADIATION DOSE REDUCTION: This exam was performed according to the departmental dose-optimization program which includes automated exposure control, adjustment of the mA and/or kV according to patient size and/or use of iterative reconstruction technique. COMPARISON:  No pertinent prior exams available for comparison. FINDINGS: Brain: Cerebral volume is normal. Partially empty sella turcica. There is no acute intracranial hemorrhage. No demarcated cortical infarct. No extra-axial fluid collection. No evidence of an intracranial mass. No midline shift. Vascular: No hyperdense vessel. Skull: No fracture or aggressive osseous lesion. Sinuses/Orbits: No  mass or acute finding within the imaged orbits. No significant paranasal sinus disease. IMPRESSION: No evidence of acute intracranial hemorrhage, acute infarct or mass. Partially empty sella turcica. While this finding often reflects incidental anatomic variation, it can alternatively be associated with idiopathic intracranial hypertension (pseudotumor cerebri). Electronically Signed   By: Kellie Simmering D.O.    On: 11/14/2021 16:28    Procedures Procedures  {Document cardiac monitor, telemetry assessment procedure when appropriate:1}  Medications Ordered in ED Medications  sodium chloride 0.9 % bolus 1,000 mL (0 mLs Intravenous Stopped 11/14/21 1638)    ED Course/ Medical Decision Making/ A&P                           Medical Decision Making Amount and/or Complexity of Data Reviewed Labs: ordered. Radiology: ordered.  Risk Prescription drug management.   Patient with urinary tract infection she will be placed on Bactrim.  CT scan shows empty sella turcica.  She is referred to neurology for further evaluation.  {Document critical care time when appropriate:1} {Document review of labs and clinical decision tools ie heart score, Chads2Vasc2 etc:1}  {Document your independent review of radiology images, and any outside records:1} {Document your discussion with family members, caretakers, and with consultants:1} {Document social determinants of health affecting pt's care:1} {Document your decision making why or why not admission, treatments were needed:1} Final Clinical Impression(s) / ED Diagnoses Final diagnoses:  Acute cystitis without hematuria  Dizziness    Rx / DC Orders ED Discharge Orders          Ordered    sulfamethoxazole-trimethoprim (BACTRIM DS) 800-160 MG tablet  2 times daily        11/14/21 1641

## 2021-11-14 NOTE — ED Triage Notes (Signed)
Pt c/o dizziness with movement upon waking at 0530, pt further endorses nausea

## 2021-11-14 NOTE — Discharge Instructions (Signed)
Follow-up with your family doctor within a week for urinary tract infection.  Follow-up with [facility] neurology to evaluate your CAT scan findings in the next month

## 2021-11-15 LAB — URINE CULTURE

## 2021-12-06 ENCOUNTER — Ambulatory Visit: Payer: 59 | Admitting: Obstetrics and Gynecology

## 2021-12-06 NOTE — Progress Notes (Deleted)
[facility] New Patient Evaluation and Consultation  Referring Provider: Chancy Milroy, MD PCP: [facility] Date of Service: 12/06/2021  SUBJECTIVE Chief Complaint: No chief complaint on file.  History of Present Illness: Stephanie Knight is a 61 y.o. Black or African-American female seen in consultation at the request of Dr. Rip Harbour for evaluation of incontinence.    Review of records significant for: Has urinary incontinence for several years. Has tried several medications.  Urinary Symptoms: {urine leakage?:24754} Leaks *** time(s) per {days/wks/mos/yrs:310907}.  Pad use: {NUMBERS 1-10:18281} {pad option:24752} per day.   She {ACTION; IS/IS JOA:41660630} bothered by her UI symptoms.  Day time voids ***.  Nocturia: *** times per night to void. Voiding dysfunction: she {empties:24755} her bladder well.  {DOES NOT does:27190::"does not"} use a catheter to empty bladder.  When urinating, she feels {urine symptoms:24756} Drinks: *** per day  UTIs: {NUMBERS 1-10:18281} UTI's in the last year.   {ACTIONS;DENIES/REPORTS:21021675::"Denies"} history of {urologic concerns:24757}  Pelvic Organ Prolapse Symptoms:                  She {denies/ admits to:24761} a feeling of a bulge the vaginal area. It has been present for {NUMBER 1-10:22536} {days/wks/mos/yrs:310907}.  She {denies/ admits to:24761} seeing a bulge.  This bulge {ACTION; IS/IS ZSW:10932355} bothersome.  Bowel Symptom: Bowel movements: *** time(s) per {Time; day/week/month:13537} Stool consistency: {stool consistency:24758} Straining: {yes/no:19897}.  Splinting: {yes/no:19897}.  Incomplete evacuation: {yes/no:19897}.  She {denies/ admits to:24761} accidental bowel leakage / fecal incontinence  Occurs: *** time(s) per {Time; day/week/month:13537}  Consistency with leakage: {stool consistency:24758} Bowel regimen: {bowel regimen:24759} Last colonoscopy: Date ***, Results ***  Sexual  Function Sexually active: {yes/no:19897}.  Sexual orientation: {Sexual [phone]} Pain with sex: {pain with sex:24762}  Pelvic Pain {denies/ admits to:24761} pelvic pain Location: *** Pain occurs: *** Prior pain treatment: *** Improved by: *** Worsened by: ***   Past Medical History:  Past Medical History:  Diagnosis Date   Anemia    with pregnancy    PONV (postoperative nausea and vomiting)      Past Surgical History:   Past Surgical History:  Procedure Laterality Date   ORIF ANKLE FRACTURE Right 05/03/2016   Procedure: OPEN REDUCTION INTERNAL FIXATION RIGHT (ORIF) ANKLE FRACTURE WEIGHT BEARING DISTAL TIBIA WITH FIXATION TIBIA AND FIBULA;  Surgeon: Renette Butters, MD;  Location: [facility];  Service: Orthopedics;  Laterality: Right;   Right blocked tear duct       Past OB/GYN History: G{NUMBERS 1-10:18281} P{NUMBERS 1-10:18281} Vaginal deliveries: ***,  Forceps/ Vacuum deliveries: ***, Cesarean section: *** Menopausal: {menopausal:24763} Contraception: ***. Last pap smear was ***.  Any history of abnormal pap smears: {yes/no:19897}.   Medications: She has a current medication list which includes the following prescription(s): naproxen sodium.   Allergies: Patient is allergic to aspirin.   Social History:  Social History   Tobacco Use   Smoking status: Every Day    Packs/day: 0.50    Years: 44.00    Total pack years: 22.00    Types: Cigarettes   Smokeless tobacco: Never  Vaping Use   Vaping Use: Never used  Substance Use Topics   Alcohol use: Yes    Comment: Socially   Drug use: Not Currently    Types: Marijuana    Comment: 10 years ago    Relationship status: {relationship status:24764} She lives with ***.   She {ACTION; IS/IS DDU:20254270} employed ***. Regular exercise: {Yes/No:304960894} History of abuse: {Yes/No:304960894}  Family History:   Family  History  Problem Relation Age of Onset   Hypertension Mother     Dementia Mother    Hypertension Father    Hypertension Sister    Colon cancer Neg Hx    Colon polyps Neg Hx    Esophageal cancer Neg Hx    Rectal cancer Neg Hx    Stomach cancer Neg Hx      Review of Systems: ROS   OBJECTIVE Physical Exam: There were no vitals filed for this visit.  Physical Exam   GU / Detailed Urogynecologic Evaluation:  Pelvic Exam: Normal external female genitalia; Bartholin's and Skene's glands normal in appearance; urethral meatus normal in appearance, no urethral masses or discharge.   CST: {gen negative/positive:315881}  Reflexes: bulbocavernosis {DESC; PRESENT/NOT PRESENT:21021351}, anocutaneous {DESC; PRESENT/NOT PRESENT:21021351} ***bilaterally.  Speculum exam reveals normal vaginal mucosa {With/Without:20273} atrophy. Cervix {exam; gyn cervix:30847}. Uterus {exam; pelvic uterus:30849}. Adnexa {exam; adnexa:12223}.    s/p hysterectomy: Speculum exam reveals normal vaginal mucosa {With/Without:20273}  atrophy and normal vaginal cuff.  Adnexa {exam; adnexa:12223}.    With apex supported, anterior compartment defect was {reduced:24765}  Pelvic floor strength {Roman # I-V:19040}/V, puborectalis {Roman # I-V:19040}/V external anal sphincter {Roman # I-V:19040}/V  Pelvic floor musculature: Right levator {Tender/Non-tender:20250}, Right obturator {Tender/Non-tender:20250}, Left levator {Tender/Non-tender:20250}, Left obturator {Tender/Non-tender:20250}  POP-Q:   POP-Q                                               Aa                                               Ba                                                 C                                                Gh                                               Pb                                               tvl                                                Ap  Bp                                                 D      Rectal Exam:   Normal sphincter tone, {rectocele:24766} distal rectocele, enterocoele {DESC; PRESENT/NOT PRESENT:21021351}, no rectal masses, {sign of:24767} dyssynergia when asking the patient to bear down.  Post-Void Residual (PVR) by Bladder Scan: In order to evaluate bladder emptying, we discussed obtaining a postvoid residual and she agreed to this procedure.  Procedure: The ultrasound unit was placed on the patient's abdomen in the suprapubic region after the patient had voided. A PVR of *** ml was obtained by bladder scan.  Laboratory Results: @ENCLABS @   ***I visualized the urine specimen, noting the specimen to be {urine color:24768}  ASSESSMENT AND PLAN Ms. Ikner is a 61 y.o. with: No diagnosis found.    62, MD   Medical Decision Making:  - Reviewed/ ordered a clinical laboratory test - Reviewed/ ordered a radiologic study - Reviewed/ ordered medicine test - Decision to obtain old records - Discussion of management of or test interpretation with an external physician / other healthcare professional  - Assessment requiring independent historian - Review and summation of prior records - Independent review of image, tracing or specimen

## 2021-12-25 ENCOUNTER — Ambulatory Visit: Payer: Self-pay | Admitting: Psychiatry

## 2022-01-22 ENCOUNTER — Telehealth: Payer: Self-pay | Admitting: Neurology

## 2022-01-22 NOTE — Telephone Encounter (Signed)
LVM and sent mychart msg informing pt of appointment change - MD out 

## 2022-01-30 ENCOUNTER — Encounter: Payer: Self-pay | Admitting: Psychiatry

## 2022-01-30 ENCOUNTER — Telehealth: Payer: Self-pay | Admitting: Psychiatry

## 2022-01-30 ENCOUNTER — Ambulatory Visit (INDEPENDENT_AMBULATORY_CARE_PROVIDER_SITE_OTHER): Payer: Medicaid Other | Admitting: Psychiatry

## 2022-01-30 VITALS — BP 129/75 | HR 75 | Ht 67.0 in | Wt 291.6 lb

## 2022-01-30 DIAGNOSIS — E236 Other disorders of pituitary gland: Secondary | ICD-10-CM | POA: Diagnosis not present

## 2022-01-30 DIAGNOSIS — R42 Dizziness and giddiness: Secondary | ICD-10-CM

## 2022-01-30 MED ORDER — MECLIZINE HCL 12.5 MG PO TABS: 12.5000 mg | ORAL_TABLET | Freq: Three times a day (TID) | ORAL | 0 refills | Status: DC | PRN

## 2022-01-30 NOTE — Patient Instructions (Signed)
Plan: -Referral to Ophthalmology for eye exam -Referral to physical therapy for vertigo -Take meclizine up to 3 times a day as needed for vertigo

## 2022-01-30 NOTE — Progress Notes (Signed)
[facility]  PATIENT: Stephanie Knight DOB: 04-13-1960  REFERRING CLINICIAN: [facility] HISTORY FROM: self REASON FOR VISIT: empty sella   HISTORICAL  CHIEF COMPLAINT:  Chief Complaint  Patient presents with   Room 1    Pt is here Alone. Pt states that she went to the hospital a month ago. Pt states no nausea or blurry vision. Pt states that when she lay down her head feels like its swimming.     HISTORY OF PRESENT ILLNESS:  The patient presents for evaluation of dizziness and empty sella. She presented to the ED 11/14/21 for dizziness. States she sat up and felt like her head was "wavy". CTH showed a partially empty sella. She was also found to have a UTI and was started on Bactrim.   She continues to report a "wavy" sensation in her head every time she lies down. Denies light-headedness or room spinning, just states her head feels funny. It is triggered mainly by lying down. This lasts for 5-7 seconds at a time.  She denies headaches or vision changes. States she has started to hear buzzing in her right ear but denies any heartbeat or whooshing sounds.   OTHER MEDICAL CONDITIONS: smoking   REVIEW OF SYSTEMS: Full 14 system review of systems performed and negative with exception of: dizziness  ALLERGIES: Allergies  Allergen Reactions   Aspirin Nausea And Vomiting    HOME MEDICATIONS: Outpatient Medications Prior to Visit  Medication Sig Dispense Refill   Naproxen Sodium (ALEVE) 220 MG CAPS Take 2 capsules by mouth daily as needed (pain). (Patient not taking: Reported on 01/30/2022)     No facility-administered medications prior to visit.    PAST MEDICAL HISTORY: Past Medical History:  Diagnosis Date   Anemia    with pregnancy    PONV (postoperative nausea and vomiting)     PAST SURGICAL HISTORY: Past Surgical History:  Procedure Laterality Date   ORIF ANKLE FRACTURE Right 05/03/2016   Procedure: OPEN REDUCTION INTERNAL  FIXATION RIGHT (ORIF) ANKLE FRACTURE WEIGHT BEARING DISTAL TIBIA WITH FIXATION TIBIA AND FIBULA;  Surgeon: Sheral Apley, MD;  Location: [facility];  Service: Orthopedics;  Laterality: Right;   Right blocked tear duct      FAMILY HISTORY: Family History  Problem Relation Age of Onset   Hypertension Mother    Dementia Mother    Hypertension Father    Hypertension Sister    Colon cancer Neg Hx    Colon polyps Neg Hx    Esophageal cancer Neg Hx    Rectal cancer Neg Hx    Stomach cancer Neg Hx     SOCIAL HISTORY: Social History   Socioeconomic History   Marital status: Single    Spouse name: Not on file   Number of children: Not on file   Years of education: Not on file   Highest education level: Some college, no degree  Occupational History   Not on file  Tobacco Use   Smoking status: Every Day    Packs/day: 0.50    Years: 44.00    Total pack years: 22.00    Types: Cigarettes   Smokeless tobacco: Never  Vaping Use   Vaping Use: Never used  Substance and Sexual Activity   Alcohol use: Yes    Comment: Socially   Drug use: Not Currently    Types: Marijuana    Comment: 10 years ago   Sexual activity: Yes    Birth control/protection: None, Condom  Other Topics Concern   Not on file  Social History Narrative   Not on file   Social Determinants of Health   Financial Resource Strain: Not on file  Food Insecurity: Not on file  Transportation Needs: No Transportation Needs (07/23/2019)   PRAPARE - Administrator, Civil Service (Medical): No    Lack of Transportation (Non-Medical): No  Physical Activity: Not on file  Stress: Not on file  Social Connections: Not on file  Intimate Partner Violence: Not on file     PHYSICAL EXAM  GENERAL EXAM/CONSTITUTIONAL: Vitals:  Vitals:   01/30/22 1122  BP: 129/75  Pulse: 75  Weight: 291 lb 9.6 oz (132.3 kg)  Height: 5\' 7"  (1.702 m)   Body mass index is 45.67 kg/m. Wt Readings from Last 3  Encounters:  01/30/22 291 lb 9.6 oz (132.3 kg)  11/14/21 300 lb (136.1 kg)  06/29/21 292 lb (132.5 kg)    NEUROLOGIC: MENTAL STATUS:  awake, alert, oriented to person, place and time recent and remote memory intact normal attention and concentration language fluent, comprehension intact, naming intact fund of knowledge appropriate  CRANIAL NERVE:  2nd - no papilledema or hemorrhages visualized on fundoscopic exam 2nd, 3rd, 4th, 6th - pupils equal and reactive to light, visual fields full to confrontation, extraocular muscles intact, no nystagmus 5th - facial sensation symmetric 7th - facial strength symmetric 8th - hearing intact 9th - palate elevates symmetrically, uvula midline 11th - shoulder shrug symmetric 12th - tongue protrusion midline  MOTOR:  normal bulk and tone, full strength in the BUE, BLE  SENSORY:  normal and symmetric to light touch all 4 extremities  COORDINATION:  finger-nose-finger intact bilaterally  REFLEXES:  deep tendon reflexes present and symmetric  GAIT/STATION:  normal  +Dix-Hallpike on left   DIAGNOSTIC DATA (LABS, IMAGING, TESTING) - I reviewed patient records, labs, notes, testing and imaging myself where available.  Lab Results  Component Value Date   WBC 8.4 11/14/2021   HGB 13.6 11/14/2021   HCT 43.9 11/14/2021   MCV 94.2 11/14/2021   PLT 355 11/14/2021      Component Value Date/Time   NA 144 11/14/2021 0652   NA 145 (H) 04/08/2019 1051   K 4.3 11/14/2021 0652   CL 105 11/14/2021 0652   CO2 31 11/14/2021 0652   GLUCOSE 113 (H) 11/14/2021 0652   BUN 9 11/14/2021 0652   BUN 8 04/08/2019 1051   CREATININE 0.93 11/14/2021 0652   CALCIUM 8.9 11/14/2021 0652   PROT 5.8 (L) 04/08/2019 1051   ALBUMIN 3.7 (L) 04/08/2019 1051   AST 13 04/08/2019 1051   ALT 10 04/08/2019 1051   ALKPHOS 102 04/08/2019 1051   BILITOT <0.2 04/08/2019 1051   GFRNONAA >60 11/14/2021 0652   GFRAA 89 04/08/2019 1051   Lab Results  Component  Value Date   CHOL 157 04/08/2019   HDL 51 04/08/2019   LDLCALC 88 04/08/2019   TRIG 98 04/08/2019   CHOLHDL 3.1 04/08/2019   Lab Results  Component Value Date   HGBA1C 5.5 04/08/2019    ASSESSMENT AND PLAN  61 y.o. year old female who presents for evaluation of dizziness and empty sella. Pupils are constricted today, but she does not appear to have any papilledema on fundus exam. Low suspicion for IIH as she does not report any headaches or vision changes. Will refer to Ophthalmology for a formal fundus exam to rule out any subtle papilledema. Dix-Hallpike exam to the left immediately elicits  vertigo. Unable to maintain this position for more than a couple of seconds as she cannot tolerate it. Suspect her symptoms are secondary to BPPV. Will refer to vestibular therapy for vertigo. Meclizine prescribed to help reduce her vertigo while waiting to start PT.   1. Empty sella (HCC)   2. Vertigo       PLAN: -Start meclizine 12.5 mg TID PRN for vertigo -Referral to vestibular therapy -Referral to ophthalmology for a formal eye exam  Orders Placed This Encounter  Procedures   Ambulatory referral to Ophthalmology   Ambulatory referral to Physical Therapy    Meds ordered this encounter  Medications   meclizine (ANTIVERT) 12.5 MG tablet    Sig: Take 1 tablet (12.5 mg total) by mouth 3 (three) times daily as needed for dizziness.    Dispense:  30 tablet    Refill:  0    Return in about 6 months (around 08/01/2022).    Ocie Doyne, MD 01/30/22 11:57 AM  I spent an average of 34 chart reviewing and counseling the patient, with at least 50% of the time face to face with the patient.   [facility] [address] [place], [place] [address] [phone]

## 2022-01-30 NOTE — Telephone Encounter (Signed)
Referral for Ophthalmology fax to [facility]. Phone: [phone], Fax: [phone]

## 2022-02-06 NOTE — Therapy (Signed)
OUTPATIENT PHYSICAL THERAPY VESTIBULAR EVALUATION     Patient Name: Stephanie Knight MRN: [id number] DOB:February 16, 1960, 61 y.o., female Today's Date: 02/07/2022  END OF SESSION:  PT End of Session - 02/07/22 1711     Visit Number 1    Number of Visits 9    Date for PT Re-Evaluation 03/07/22    Authorization Type Medicaid-[employer] 2023    Authorization Time Period Auth: Required after 12th visit    PT Start Time 1625    PT Stop Time 1705    PT Time Calculation (min) 40 min    Activity Tolerance Patient tolerated treatment well    Behavior During Therapy [facility] for tasks assessed/performed;Anxious             Past Medical History:  Diagnosis Date   Anemia    with pregnancy    PONV (postoperative nausea and vomiting)    Past Surgical History:  Procedure Laterality Date   ORIF ANKLE FRACTURE Right 05/03/2016   Procedure: OPEN REDUCTION INTERNAL FIXATION RIGHT (ORIF) ANKLE FRACTURE WEIGHT BEARING DISTAL TIBIA WITH FIXATION TIBIA AND FIBULA;  Surgeon: Sheral Apley, MD;  Location: [facility];  Service: Orthopedics;  Laterality: Right;   Right blocked tear duct     Patient Active Problem List   Diagnosis Date Noted   Visit for routine gyn exam 06/29/2021   Screening mammogram, encounter for 06/29/2021   Colon cancer screening 06/29/2021   Tobacco abuse 05/25/2019   Urge incontinence of urine 05/25/2019    [facility]P: [facility]  REFERRING PROVIDER: Ocie Doyne, MD   REFERRING DIAG: R42 (ICD-10-CM) - Vertigo  THERAPY DIAG:  BPPV (benign paroxysmal positional vertigo), bilateral  Unsteadiness on feet  ONSET DATE: 11/14/21  Rationale for Evaluation and Treatment: Rehabilitation  SUBJECTIVE:   SUBJECTIVE STATEMENT: Patient reports dizziness for the past couple of months. First episode occurred when waking up. Has been about the same since initial onset. Only occurs when getting up out of bed, laying down. Episodes are  described as "woozy, lightheaded, wavy" and lasts minutes. Denies head trauma, infection/illness, vision changes/double vision, hearing loss, otalgia, photo/phonophobia, migraines. Reports "sounds like a bee in my ear" on the R side.   Pt accompanied by: self  PERTINENT HISTORY: Anemia, R ankle ORIF 2018  PAIN:  Are you having pain? No  PRECAUTIONS: Fall  WEIGHT BEARING RESTRICTIONS: No  FALLS: Has patient fallen in last 6 months? 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 Partially empty sella turcica. While this finding often reflects incidental anatomic variation, it can alternatively be associated with idiopathic intracranial hypertension (pseudotumor cerebri).  COGNITION: Overall cognitive status: Within functional limits for tasks assessed   SENSATION: Reports N/T in B hands- ongoing for months- years; also c/o N/T in R foot s/p surgery     POSTURE:  rounded shoulders     LOWER EXTREMITY MMT:   MMT Right eval Left eval  Hip flexion    Hip abduction    Hip adduction    Hip internal rotation    Hip external rotation    Knee flexion  Knee extension    Ankle dorsiflexion    Ankle plantarflexion    Ankle inversion    Ankle eversion    (Blank rows = not tested)   GAIT: Gait pattern:  wide BOS with lateral trunk lean, poor foot clearance, slowed Assistive device utilized: None Level of assistance: Modified independence   FUNCTIONAL TESTS:  NT  PATIENT SURVEYS:  FOTO 54  VESTIBULAR ASSESSMENT:  GENERAL OBSERVATION: pt wears readers;  head positioned in slight L rotation   OCULOMOTOR EXAM:  Ocular Alignment: normal  Ocular ROM: No Limitations  Spontaneous Nystagmus: absent  Gaze-Induced Nystagmus: absent  Smooth Pursuits: intact  Saccades: intact  Convergence/Divergence: no report of diplopia    VESTIBULAR - OCULAR REFLEX:   Slow VOR: Comment: intact horizontal and vertical   VOR Cancellation: Unable to Maintain Gaze; difficulty tracking   Head-Impulse Test: HIT Right: negative HIT Left: negative      POSITIONAL TESTING:  Right Roll Test: R upbeating torsional nystagmus; Duration: 20 sec Left Roll Test: negative; Duration: c/o similar dizziness Right Dix-Hallpike: NT Left Dix-Hallpike: L upbeating torsional nystagmus; Duration: 30 sec Right Sidelying: very brief R upbeating torsional nystamgus; less intense on this side  Left Sidelying: L upbeating torsional nystamgus ; Duration: 30 sec     VESTIBULAR TREATMENT:                                                                                                   DATE: 02/08/22    Canalith Repositioning:  Epley Left: Number of Reps: 1, Response to Treatment: symptoms improved, and Comment: tolerated well; upon R head turn pt with R upbeating torsional nystagmus lasting 15 sec   PATIENT EDUCATION: Education details: prognosis, POC, edu on BPPV and its treatment as well as post-CRM expectations  Person educated: Patient Education method: Explanation, Demonstration, Tactile cues, and Verbal cues Education comprehension: verbalized understanding  HOME EXERCISE PROGRAM:   GOALS: Goals reviewed with patient? Yes  SHORT TERM GOALS: Target date: 02/21/2022  Patient to be independent with initial HEP. Baseline: HEP initiated Goal status: INITIAL    LONG TERM GOALS: Target date: 03/07/2022  Patient to be independent with advanced HEP. Baseline: Not yet initiated  Goal status: INITIAL  Patient will report 0/10 dizziness with bed mobility.  Baseline:  Symptomatic  Goal status: INITIAL  Patient to demonstrate mild-mod sway with M-CTSIB condition with eyes closed/foam surface in order to improve safety in environments with uneven surfaces and dim lighting. Baseline: NT Goal status: INITIAL  Patient to score at least 65 on FOTO in order to indicate improved functional outcomes.  Baseline: 54 Goal status: INITIAL    ASSESSMENT:  CLINICAL IMPRESSION:   Patient is a 61 y/o F presenting to OPPT with c/o dizziness for the past couple months. Episodes are described as "woozy, lightheaded, wavy" and last minutes. Worse with getting up out of bed, laying down. Denies head trauma, infection/illness, vision changes/double vision, hearing loss, otalgia, photo/phonophobia, migraines. Reports "sounds like a bee in my ear" on the R side. Oculomotor exam was grossly normal. Positional testing was positive for B posterior  canalithiasis. Patient treated with L Epley x1 which was tolerated well. Would benefit from skilled PT services 1-2x/week for 4 weeks to address aforementioned impairments in order to optimize level of function.    OBJECTIVE IMPAIRMENTS: Abnormal gait, decreased activity tolerance, decreased balance, and dizziness.   ACTIVITY LIMITATIONS: bending, standing, squatting, sleeping, stairs, transfers, bed mobility, bathing, dressing, reach over head, and hygiene/grooming  PARTICIPATION LIMITATIONS: meal prep, cleaning, laundry, driving, and shopping  PERSONAL FACTORS: Age, Behavior pattern, Time since onset of injury/illness/exacerbation, and 1-2 comorbidities: Anemia, R ankle ORIF 2018  are also affecting patient's functional outcome.   REHAB POTENTIAL: Good  CLINICAL DECISION MAKING: Evolving/moderate complexity  EVALUATION COMPLEXITY: Moderate   PLAN:  PT FREQUENCY: 1-2x/week  PT DURATION: 4 weeks  PLANNED INTERVENTIONS: Therapeutic exercises, Therapeutic activity, Neuromuscular re-education, Balance training, Gait training,  Patient/Family education, Self Care, Joint mobilization, Stair training, Vestibular training, Canalith repositioning, Cryotherapy, Moist heat, Taping, Manual therapy, and Re-evaluation  PLAN FOR NEXT SESSION: reassess L DH; treat R BPPV; MCTSIB   Anette Guarneri, PT, DPT 02/07/22 5:20 PM  [facility] [address], [address] [place], [place] [address] Phone # [phone] Fax # [phone]

## 2022-02-07 ENCOUNTER — Other Ambulatory Visit: Payer: Self-pay

## 2022-02-07 ENCOUNTER — Encounter: Payer: Self-pay | Admitting: Physical Therapy

## 2022-02-07 ENCOUNTER — Ambulatory Visit: Payer: No Typology Code available for payment source | Attending: Psychiatry | Admitting: Physical Therapy

## 2022-02-07 DIAGNOSIS — R42 Dizziness and giddiness: Secondary | ICD-10-CM | POA: Insufficient documentation

## 2022-02-07 DIAGNOSIS — H8113 Benign paroxysmal vertigo, bilateral: Secondary | ICD-10-CM | POA: Diagnosis present

## 2022-02-07 DIAGNOSIS — R2681 Unsteadiness on feet: Secondary | ICD-10-CM | POA: Diagnosis present

## 2022-02-15 ENCOUNTER — Ambulatory Visit: Payer: No Typology Code available for payment source | Attending: Psychiatry

## 2022-02-15 DIAGNOSIS — R2681 Unsteadiness on feet: Secondary | ICD-10-CM | POA: Insufficient documentation

## 2022-02-15 DIAGNOSIS — H8113 Benign paroxysmal vertigo, bilateral: Secondary | ICD-10-CM | POA: Insufficient documentation

## 2022-02-19 NOTE — Therapy (Signed)
OUTPATIENT PHYSICAL THERAPY VESTIBULAR TREATMENT     Patient Name: Stephanie Knight MRN: [id number] DOB:1960/05/19, 62 y.o., female Today's Date: 02/19/2022  END OF SESSION:    Past Medical History:  Diagnosis Date   Anemia    with pregnancy    PONV (postoperative nausea and vomiting)    Past Surgical History:  Procedure Laterality Date   ORIF ANKLE FRACTURE Right 05/03/2016   Procedure: OPEN REDUCTION INTERNAL FIXATION RIGHT (ORIF) ANKLE FRACTURE WEIGHT BEARING DISTAL TIBIA WITH FIXATION TIBIA AND FIBULA;  Surgeon: Renette Butters, MD;  Location: [facility];  Service: Orthopedics;  Laterality: Right;   Right blocked tear duct     Patient Active Problem List   Diagnosis Date Noted   Visit for routine gyn exam 06/29/2021   Screening mammogram, encounter for 06/29/2021   Colon cancer screening 06/29/2021   Tobacco abuse 05/25/2019   Urge incontinence of urine 05/25/2019    PCP: [facility] PROVIDER: Genia Harold, MD   REFERRING DIAG: R42 (ICD-10-CM) - Vertigo  THERAPY DIAG:  No diagnosis found.  ONSET DATE: 11/14/21  Rationale for Evaluation and Treatment: Rehabilitation  SUBJECTIVE:   SUBJECTIVE STATEMENT: Patient reports dizziness for the past couple of months. First episode occurred when waking up. Has been about the same since initial onset. Only occurs when getting up out of bed, laying down. Episodes are described as "woozy, lightheaded, wavy" and lasts minutes. Denies head trauma, infection/illness, vision changes/double vision, hearing loss, otalgia, photo/phonophobia, migraines. Reports "sounds like a bee in my ear" on the R side.   Pt accompanied by: self  PERTINENT HISTORY: Anemia, R ankle ORIF 2018  PAIN:  Are you having pain? No  PRECAUTIONS: Fall  WEIGHT BEARING RESTRICTIONS: No  FALLS: Has patient fallen in last 6 months? No  LIVING ENVIRONMENT: Lives with: lives with their son Lives in:  House/apartment Stairs:  1 step to enter with handrail, then 3 steps to get into the other entrance without handrail Has following equipment at home: Crutches  PLOF: Independent; works full time working from home in [place]: improve dizziness  OBJECTIVE:      TODAY'S TREATMENT: 02/20/22 Activity Comments                             Below measures were taken at time of initial evaluation unless otherwise specified:   DIAGNOSTIC FINDINGS: 11/14/21 head CT: No evidence of acute intracranial hemorrhage, acute infarct or mass. Partially empty sella turcica. While this finding often reflects incidental anatomic variation, it can alternatively be associated with idiopathic intracranial hypertension (pseudotumor cerebri).  COGNITION: Overall cognitive status: Within functional limits for tasks assessed   SENSATION: Reports N/T in B hands- ongoing for months- years; also c/o N/T in R foot s/p surgery     POSTURE:  rounded shoulders     LOWER EXTREMITY MMT:   MMT Right eval Left eval  Hip flexion    Hip abduction    Hip adduction    Hip internal rotation    Hip external rotation    Knee flexion    Knee extension    Ankle dorsiflexion    Ankle plantarflexion    Ankle inversion    Ankle eversion    (Blank rows = not tested)   GAIT: Gait pattern:  wide BOS with lateral trunk lean, poor foot clearance, slowed Assistive device utilized: None Level of assistance: Modified independence  FUNCTIONAL TESTS:  NT  PATIENT SURVEYS:  FOTO 44  VESTIBULAR ASSESSMENT:  GENERAL OBSERVATION: pt wears readers; head positioned in slight L rotation   OCULOMOTOR EXAM:  Ocular Alignment: normal  Ocular ROM: No Limitations  Spontaneous Nystagmus: absent  Gaze-Induced Nystagmus: absent  Smooth Pursuits: intact  Saccades: intact  Convergence/Divergence: no report of diplopia    VESTIBULAR - OCULAR REFLEX:   Slow VOR: Comment: intact  horizontal and vertical   VOR Cancellation: Unable to Maintain Gaze; difficulty tracking   Head-Impulse Test: HIT Right: negative HIT Left: negative      POSITIONAL TESTING:  Right Roll Test: R upbeating torsional nystagmus; Duration: 20 sec Left Roll Test: negative; Duration: c/o similar dizziness Right Dix-Hallpike: NT Left Dix-Hallpike: L upbeating torsional nystagmus; Duration: 30 sec Right Sidelying: very brief R upbeating torsional nystamgus; less intense on this side  Left Sidelying: L upbeating torsional nystamgus ; Duration: 30 sec     VESTIBULAR TREATMENT:                                                                                                   DATE: 02/08/22    Canalith Repositioning:  Epley Left: Number of Reps: 1, Response to Treatment: symptoms improved, and Comment: tolerated well; upon R head turn pt with R upbeating torsional nystagmus lasting 15 sec   PATIENT EDUCATION: Education details: prognosis, POC, edu on BPPV and its treatment as well as post-CRM expectations  Person educated: Patient Education method: Explanation, Demonstration, Tactile cues, and Verbal cues Education comprehension: verbalized understanding  HOME EXERCISE PROGRAM:   GOALS: Goals reviewed with patient? Yes  SHORT TERM GOALS: Target date: 02/21/2022  Patient to be independent with initial HEP. Baseline: HEP initiated Goal status: IN PROGRESS    LONG TERM GOALS: Target date: 03/07/2022  Patient to be independent with advanced HEP. Baseline: Not yet initiated  Goal status: IN PROGRESS  Patient will report 0/10 dizziness with bed mobility.  Baseline: Symptomatic  Goal status: IN PROGRESS  Patient to demonstrate mild-mod sway with M-CTSIB condition with eyes closed/foam surface in order to improve safety in environments with uneven surfaces and dim lighting. Baseline: NT Goal status: IN PROGRESS  Patient to score at least 65 on FOTO in order to indicate improved  functional outcomes.  Baseline: 54 Goal status: IN PROGRESS    ASSESSMENT:  CLINICAL IMPRESSION:   Patient is a 62 y/o F presenting to OPPT with c/o dizziness for the past couple months. Episodes are described as "woozy, lightheaded, wavy" and last minutes. Worse with getting up out of bed, laying down. Denies head trauma, infection/illness, vision changes/double vision, hearing loss, otalgia, photo/phonophobia, migraines. Reports "sounds like a bee in my ear" on the R side. Oculomotor exam was grossly normal. Positional testing was positive for B posterior canalithiasis. Patient treated with L Epley x1 which was tolerated well. Would benefit from skilled PT services 1-2x/week for 4 weeks to address aforementioned impairments in order to optimize level of function.    OBJECTIVE IMPAIRMENTS: Abnormal gait, decreased activity tolerance, decreased balance, and dizziness.   ACTIVITY LIMITATIONS: bending, standing,  squatting, sleeping, stairs, transfers, bed mobility, bathing, dressing, reach over head, and hygiene/grooming  PARTICIPATION LIMITATIONS: meal prep, cleaning, laundry, driving, and shopping  PERSONAL FACTORS: Age, Behavior pattern, Time since onset of injury/illness/exacerbation, and 1-2 comorbidities: Anemia, R ankle ORIF 2018  are also affecting patient's functional outcome.   REHAB POTENTIAL: Good  CLINICAL DECISION MAKING: Evolving/moderate complexity  EVALUATION COMPLEXITY: Moderate   PLAN:  PT FREQUENCY: 1-2x/week  PT DURATION: 4 weeks  PLANNED INTERVENTIONS: Therapeutic exercises, Therapeutic activity, Neuromuscular re-education, Balance training, Gait training, Patient/Family education, Self Care, Joint mobilization, Stair training, Vestibular training, Canalith repositioning, Cryotherapy, Moist heat, Taping, Manual therapy, and Re-evaluation  PLAN FOR NEXT SESSION: reassess L DH; treat R BPPV; MCTSIB   Anette Guarneri, PT, DPT 02/19/22 9:59 AM  [facility] at [facility] [address], [address] [place], [place] [address] Phone # [phone] Fax # [phone]

## 2022-02-20 ENCOUNTER — Encounter: Payer: Self-pay | Admitting: Physical Therapy

## 2022-02-20 ENCOUNTER — Ambulatory Visit: Payer: No Typology Code available for payment source | Admitting: Physical Therapy

## 2022-02-20 DIAGNOSIS — R2681 Unsteadiness on feet: Secondary | ICD-10-CM

## 2022-02-20 DIAGNOSIS — H8113 Benign paroxysmal vertigo, bilateral: Secondary | ICD-10-CM | POA: Diagnosis present

## 2022-02-22 ENCOUNTER — Ambulatory Visit: Payer: No Typology Code available for payment source | Admitting: Physical Therapy

## 2022-02-26 ENCOUNTER — Ambulatory Visit: Payer: No Typology Code available for payment source | Admitting: Physical Therapy

## 2022-02-28 ENCOUNTER — Ambulatory Visit: Payer: No Typology Code available for payment source | Admitting: Physical Therapy

## 2022-02-28 ENCOUNTER — Ambulatory Visit: Payer: No Typology Code available for payment source | Admitting: Neurology

## 2022-03-01 ENCOUNTER — Ambulatory Visit: Payer: No Typology Code available for payment source | Admitting: Neurology

## 2022-03-05 ENCOUNTER — Ambulatory Visit: Payer: No Typology Code available for payment source | Admitting: Physical Therapy

## 2022-03-07 ENCOUNTER — Ambulatory Visit: Payer: No Typology Code available for payment source | Admitting: Physical Therapy

## 2022-08-20 ENCOUNTER — Ambulatory Visit: Payer: Medicaid Other | Admitting: Family Medicine

## 2022-08-20 NOTE — Progress Notes (Deleted)
No chief complaint on file.   HISTORY OF PRESENT ILLNESS:  08/20/22 ALL:  Stephanie Knight is a 62 y.o. female here today for follow up for empty sella and vertigo. She was seen in consult with Dr Delena Bali 01/2022. 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  Since,    HISTORY (copied from Dr Quentin Mulling previous note)  The patient presents for evaluation of dizziness and empty sella. She presented to the ED 11/14/21 for dizziness. States she sat up and felt like her head was "wavy". CTH showed a partially empty sella. She was also found to have a UTI and was started on Bactrim.    She continues to report a "wavy" sensation in her head every time she lies down. Denies light-headedness or room spinning, just states her head feels funny. It is triggered mainly by lying down. This lasts for 5-7 seconds at a time.   She denies headaches or vision changes. States she has started to hear buzzing in her right ear but denies any heartbeat or whooshing sounds.   REVIEW OF SYSTEMS: Out of a complete 14 system review of symptoms, the patient complains only of the following symptoms, dizziness and all other reviewed systems are negative.   ALLERGIES: Allergies  Allergen Reactions   Aspirin Nausea And Vomiting     HOME MEDICATIONS: Outpatient Medications Prior to Visit  Medication Sig Dispense Refill   meclizine (ANTIVERT) 12.5 MG tablet Take 1 tablet (12.5 mg total) by mouth 3 (three) times daily as needed for dizziness. 30 tablet 0   Naproxen Sodium (ALEVE) 220 MG CAPS Take 2 capsules by mouth daily as needed (pain). (Patient not taking: Reported on 01/30/2022)     No facility-administered medications prior to visit.     PAST MEDICAL HISTORY: Past Medical History:  Diagnosis Date   Anemia    with pregnancy    PONV (postoperative nausea and vomiting)      PAST SURGICAL HISTORY: Past Surgical History:  Procedure Laterality  Date   ORIF ANKLE FRACTURE Right 05/03/2016   Procedure: OPEN REDUCTION INTERNAL FIXATION RIGHT (ORIF) ANKLE FRACTURE WEIGHT BEARING DISTAL TIBIA WITH FIXATION TIBIA AND FIBULA;  Surgeon: Sheral Apley, MD;  Location: [facility];  Service: Orthopedics;  Laterality: Right;   Right blocked tear duct       FAMILY HISTORY: Family History  Problem Relation Age of Onset   Hypertension Mother    Dementia Mother    Hypertension Father    Hypertension Sister    Colon cancer Neg Hx    Colon polyps Neg Hx    Esophageal cancer Neg Hx    Rectal cancer Neg Hx    Stomach cancer Neg Hx      SOCIAL HISTORY: Social History   Socioeconomic History   Marital status: Single    Spouse name: Not on file   Number of children: Not on file   Years of education: Not on file   Highest education level: Some college, no degree  Occupational History   Not on file  Tobacco Use   Smoking status: Every Day    Packs/day: 0.50    Years: 44.00    Additional pack years: 0.00    Total pack years: 22.00    Types: Cigarettes   Smokeless tobacco: Never  Vaping Use   Vaping Use: Never used  Substance and Sexual Activity   Alcohol use: Yes    Comment: Socially  Drug use: Not Currently    Types: Marijuana    Comment: 10 years ago   Sexual activity: Yes    Birth control/protection: None, Condom  Other Topics Concern   Not on file  Social History Narrative   Not on file   Social Determinants of Health   Financial Resource Strain: Not on file  Food Insecurity: Not on file  Transportation Needs: No Transportation Needs (07/23/2019)   PRAPARE - Administrator, Civil Service (Medical): No    Lack of Transportation (Non-Medical): No  Physical Activity: Not on file  Stress: Not on file  Social Connections: Not on file  Intimate Partner Violence: Not on file     PHYSICAL EXAM  There were no vitals filed for this visit. There is no height or weight on file to calculate  BMI.  Generalized: Well developed, in no acute distress  Cardiology: normal rate and rhythm, no murmur auscultated  Respiratory: clear to auscultation bilaterally    Neurological examination  Mentation: Alert oriented to time, place, history taking. Follows all commands speech and language fluent Cranial nerve II-XII: Pupils were equal round reactive to light. Extraocular movements were full, visual field were full on confrontational test. Facial sensation and strength were normal. Uvula tongue midline. Head turning and shoulder shrug  were normal and symmetric. Motor: The motor testing reveals 5 over 5 strength of all 4 extremities. Good symmetric motor tone is noted throughout.  Sensory: Sensory testing is intact to soft touch on all 4 extremities. No evidence of extinction is noted.  Coordination: Cerebellar testing reveals good finger-nose-finger and heel-to-shin bilaterally.  Gait and station: Gait is normal. Tandem gait is normal. Romberg is negative. No drift is seen.  Reflexes: Deep tendon reflexes are symmetric and normal bilaterally.    DIAGNOSTIC DATA (LABS, IMAGING, TESTING) - I reviewed patient records, labs, notes, testing and imaging myself where available.  Lab Results  Component Value Date   WBC 8.4 11/14/2021   HGB 13.6 11/14/2021   HCT 43.9 11/14/2021   MCV 94.2 11/14/2021   PLT 355 11/14/2021      Component Value Date/Time   NA 144 11/14/2021 0652   NA 145 (H) 04/08/2019 1051   K 4.3 11/14/2021 0652   CL 105 11/14/2021 0652   CO2 31 11/14/2021 0652   GLUCOSE 113 (H) 11/14/2021 0652   BUN 9 11/14/2021 0652   BUN 8 04/08/2019 1051   CREATININE 0.93 11/14/2021 0652   CALCIUM 8.9 11/14/2021 0652   PROT 5.8 (L) 04/08/2019 1051   ALBUMIN 3.7 (L) 04/08/2019 1051   AST 13 04/08/2019 1051   ALT 10 04/08/2019 1051   ALKPHOS 102 04/08/2019 1051   BILITOT <0.2 04/08/2019 1051   GFRNONAA >60 11/14/2021 0652   GFRAA 89 04/08/2019 1051   Lab Results  Component  Value Date   CHOL 157 04/08/2019   HDL 51 04/08/2019   LDLCALC 88 04/08/2019   TRIG 98 04/08/2019   CHOLHDL 3.1 04/08/2019   Lab Results  Component Value Date   HGBA1C 5.5 04/08/2019   No results found for: "VITAMINB12" No results found for: "TSH"      No data to display               No data to display           ASSESSMENT AND PLAN  62 y.o. year old female  has a past medical history of Anemia and PONV (postoperative nausea and vomiting). here with  No diagnosis found.  [name] ***.  Healthy lifestyle habits encouraged. *** will follow up with PCP as directed. *** will return to see me in ***, sooner if needed. *** verbalizes understanding and agreement with this plan.   No orders of the defined types were placed in this encounter.    No orders of the defined types were placed in this encounter.    Shawnie Dapper, MSN, FNP-C 08/20/2022, 8:09 AM  [facility] [address] [place], [place] [address] [phone]

## 2022-10-19 ENCOUNTER — Other Ambulatory Visit: Payer: Self-pay | Admitting: Emergency Medicine

## 2022-10-19 DIAGNOSIS — Z122 Encounter for screening for malignant neoplasm of respiratory organs: Secondary | ICD-10-CM

## 2022-10-19 DIAGNOSIS — F17210 Nicotine dependence, cigarettes, uncomplicated: Secondary | ICD-10-CM

## 2022-10-19 DIAGNOSIS — Z87891 Personal history of nicotine dependence: Secondary | ICD-10-CM

## 2022-11-02 ENCOUNTER — Other Ambulatory Visit: Payer: No Typology Code available for payment source

## 2022-11-12 ENCOUNTER — Other Ambulatory Visit: Payer: Self-pay | Admitting: Acute Care

## 2022-11-12 ENCOUNTER — Inpatient Hospital Stay: Admission: RE | Admit: 2022-11-12 | Payer: No Typology Code available for payment source | Source: Ambulatory Visit

## 2022-11-12 ENCOUNTER — Encounter: Payer: Self-pay | Admitting: Acute Care

## 2023-09-29 IMAGING — MG MM DIGITAL SCREENING BILAT W/ TOMO AND CAD
8 series · 8 of 24 positions shown · non-contrast
Comparison: None available.

CLINICAL DATA: Screening.

EXAM:
DIGITAL SCREENING BILATERAL MAMMOGRAM WITH TOMOSYNTHESIS AND CAD
TECHNIQUE: Bilateral screening digital craniocaudal and mediolateral oblique
mammograms were obtained. Bilateral screening digital breast
tomosynthesis was performed. The images were evaluated with
computer-aided detection.

[L CC synth-2D]
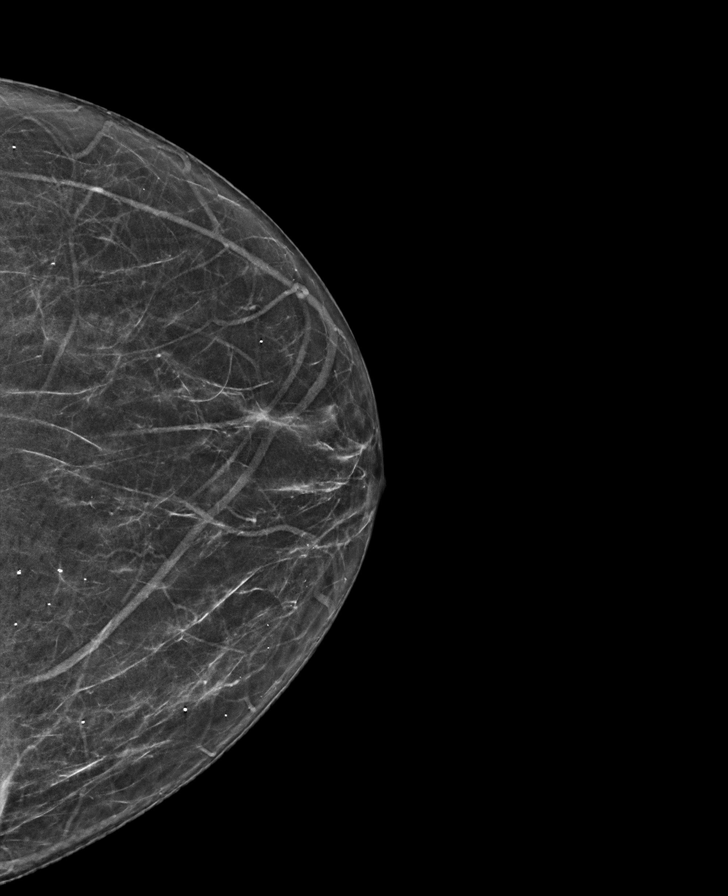

[R MLO synth-2D]
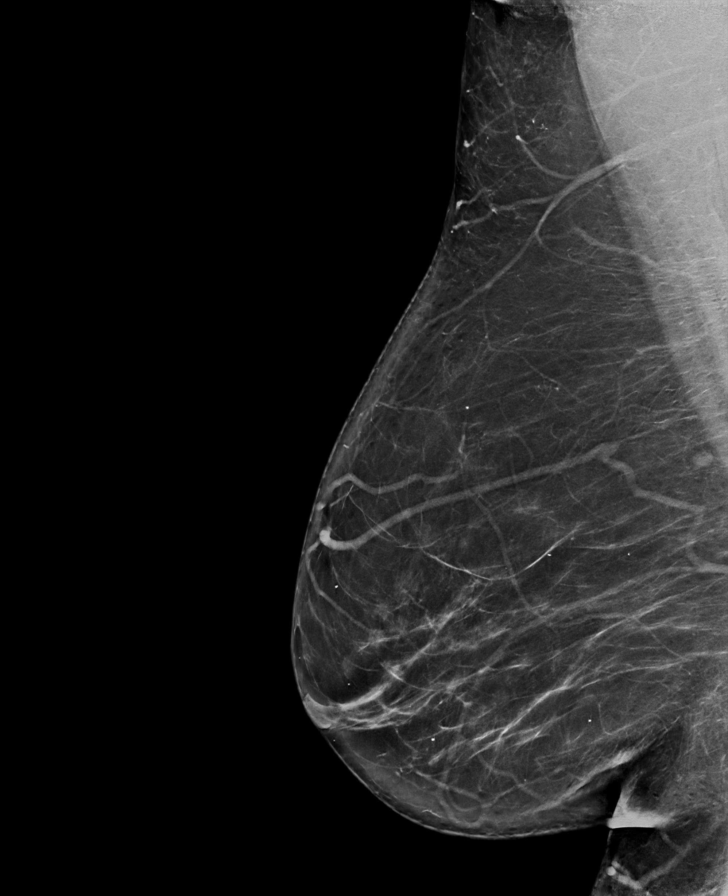

[R CC synth-2D]
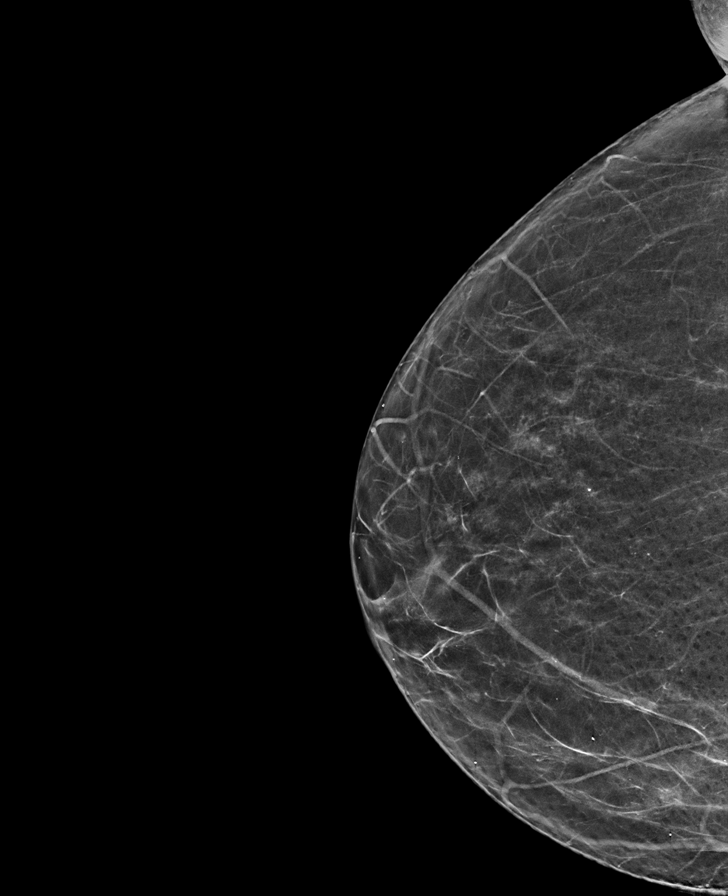

[L MLO synth-2D]
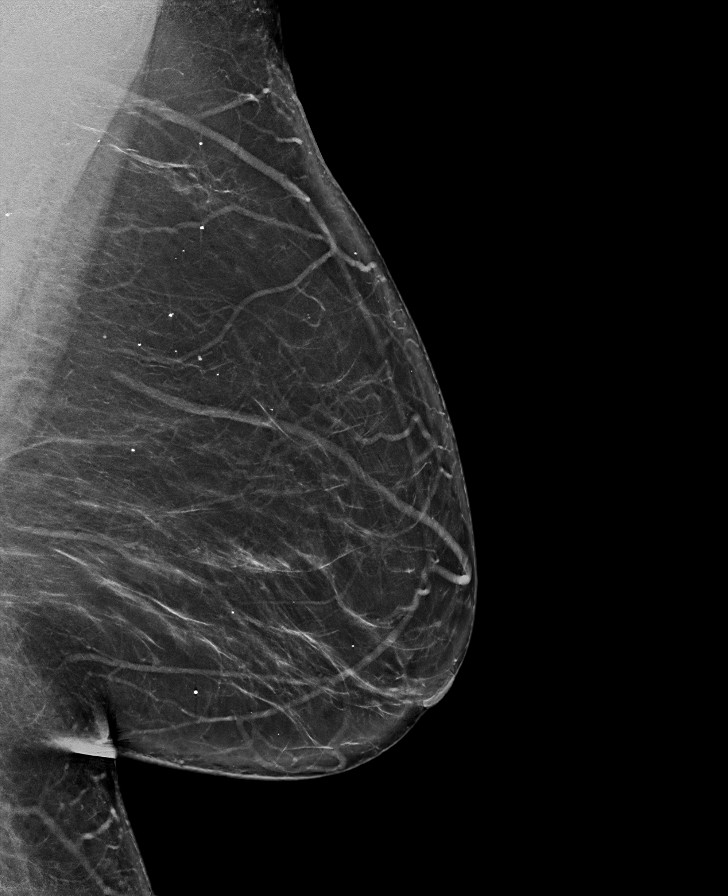

[R CC tomo · tomo slice 33/64.0]
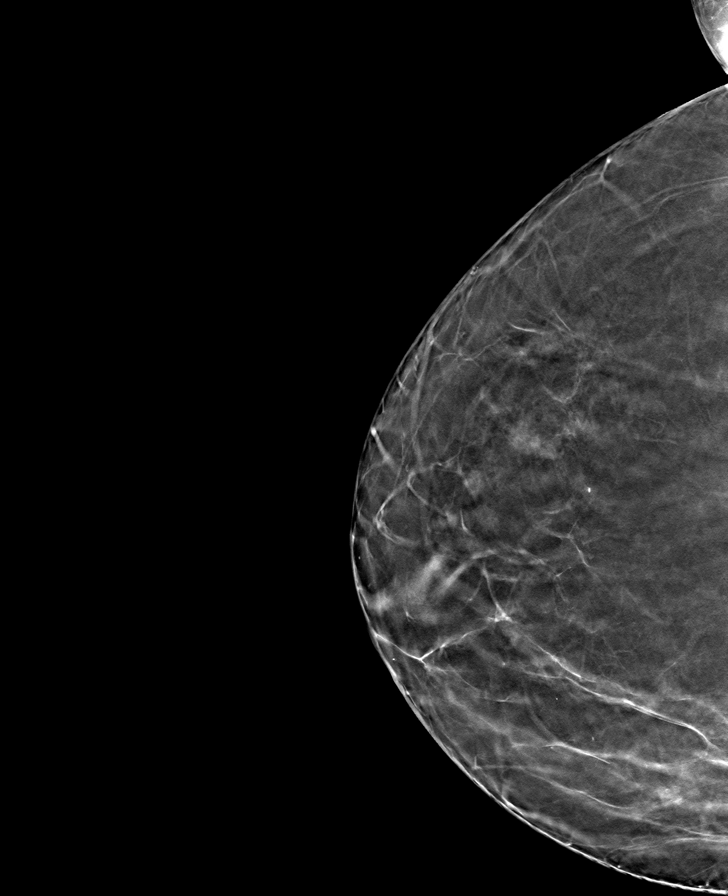

[L MLO tomo · tomo slice 45/90.0]
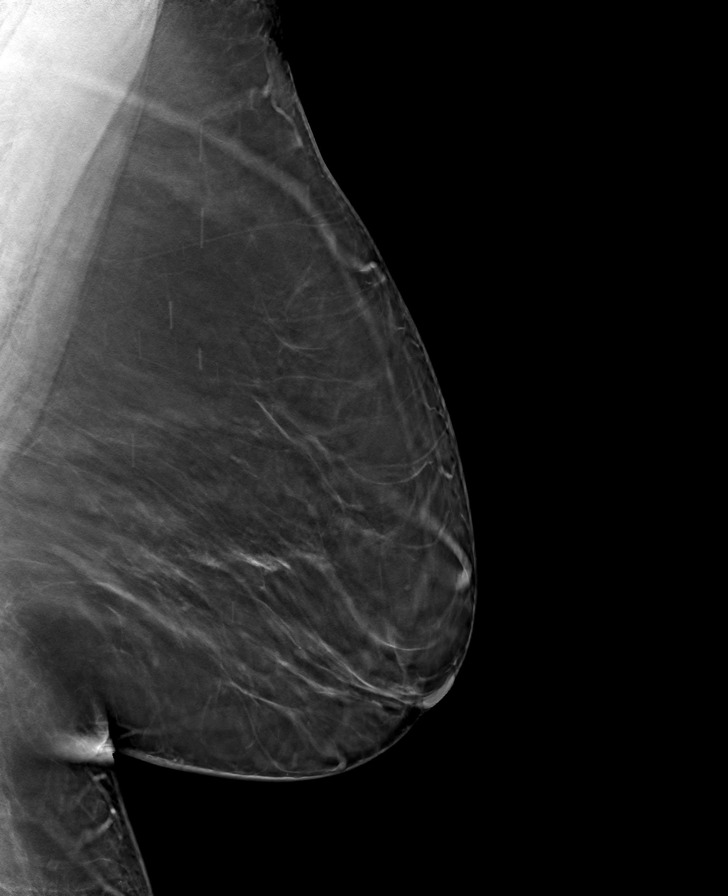

[L CC tomo · tomo slice 31/62.0]
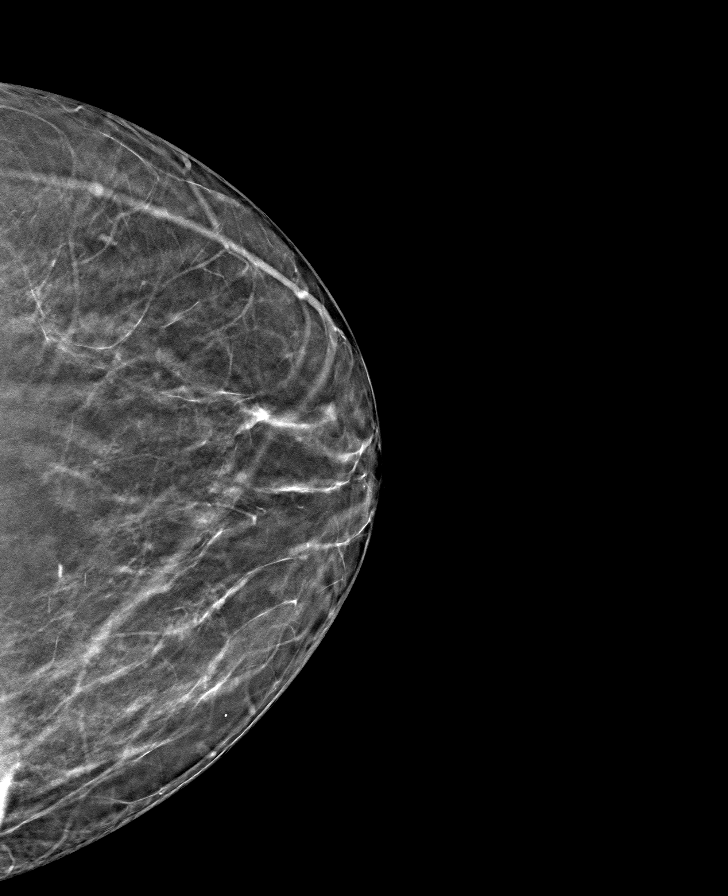

[R MLO tomo · tomo slice 45/89.0]
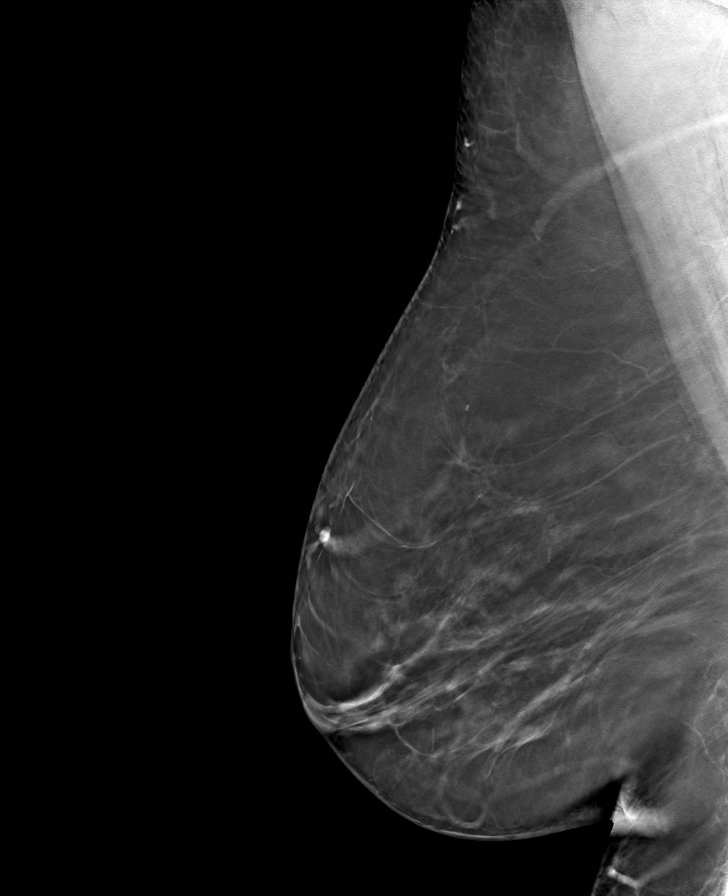

[8 of 24 positions shown; findings below may reference images not displayed]

ACR Breast Density Category b: There are scattered areas of
fibroglandular density.
FINDINGS: There are no findings suspicious for malignancy.
IMPRESSION: No mammographic evidence of malignancy. A result letter of this
screening mammogram will be mailed directly to the patient.

RECOMMENDATION:
Screening mammogram in one year. (Code:GB-Z-FIU)

BI-RADS CATEGORY  1: Negative.

## 2024-02-20 ENCOUNTER — Other Ambulatory Visit: Payer: Self-pay | Admitting: Specialist

## 2024-02-20 DIAGNOSIS — J811 Chronic pulmonary edema: Secondary | ICD-10-CM

## 2024-02-20 DIAGNOSIS — F17210 Nicotine dependence, cigarettes, uncomplicated: Secondary | ICD-10-CM

## 2024-02-22 ENCOUNTER — Inpatient Hospital Stay (HOSPITAL_BASED_OUTPATIENT_CLINIC_OR_DEPARTMENT_OTHER)
Admission: EM | Admit: 2024-02-22 | Discharge: 2024-03-02 | DRG: 291 | Disposition: A | Discharge status: Home / Self Care | Payer: Self-pay | Attending: Internal Medicine | Admitting: Internal Medicine

## 2024-02-22 ENCOUNTER — Emergency Department (HOSPITAL_BASED_OUTPATIENT_CLINIC_OR_DEPARTMENT_OTHER): Payer: Self-pay | Admitting: Radiology

## 2024-02-22 ENCOUNTER — Other Ambulatory Visit: Payer: Self-pay

## 2024-02-22 DIAGNOSIS — I5033 Acute on chronic diastolic (congestive) heart failure: Secondary | ICD-10-CM | POA: Diagnosis present

## 2024-02-22 DIAGNOSIS — G9341 Metabolic encephalopathy: Secondary | ICD-10-CM | POA: Diagnosis present

## 2024-02-22 DIAGNOSIS — J9622 Acute and chronic respiratory failure with hypercapnia: Secondary | ICD-10-CM | POA: Diagnosis present

## 2024-02-22 DIAGNOSIS — I2781 Cor pulmonale (chronic): Secondary | ICD-10-CM | POA: Diagnosis present

## 2024-02-22 DIAGNOSIS — Z9981 Dependence on supplemental oxygen: Secondary | ICD-10-CM | POA: Diagnosis not present

## 2024-02-22 DIAGNOSIS — Z6841 Body Mass Index (BMI) 40.0 and over, adult: Secondary | ICD-10-CM | POA: Diagnosis not present

## 2024-02-22 DIAGNOSIS — E8729 Other acidosis: Secondary | ICD-10-CM | POA: Diagnosis present

## 2024-02-22 DIAGNOSIS — D751 Secondary polycythemia: Secondary | ICD-10-CM | POA: Diagnosis present

## 2024-02-22 DIAGNOSIS — J441 Chronic obstructive pulmonary disease with (acute) exacerbation: Secondary | ICD-10-CM | POA: Diagnosis present

## 2024-02-22 DIAGNOSIS — Z781 Physical restraint status: Secondary | ICD-10-CM

## 2024-02-22 DIAGNOSIS — E662 Morbid (severe) obesity with alveolar hypoventilation: Secondary | ICD-10-CM | POA: Diagnosis present

## 2024-02-22 DIAGNOSIS — J9621 Acute and chronic respiratory failure with hypoxia: Secondary | ICD-10-CM | POA: Diagnosis present

## 2024-02-22 DIAGNOSIS — I2721 Secondary pulmonary arterial hypertension: Secondary | ICD-10-CM | POA: Diagnosis present

## 2024-02-22 DIAGNOSIS — I5082 Biventricular heart failure: Secondary | ICD-10-CM | POA: Diagnosis present

## 2024-02-22 DIAGNOSIS — N179 Acute kidney failure, unspecified: Secondary | ICD-10-CM | POA: Diagnosis present

## 2024-02-22 DIAGNOSIS — J9601 Acute respiratory failure with hypoxia: Secondary | ICD-10-CM | POA: Diagnosis present

## 2024-02-22 DIAGNOSIS — F17210 Nicotine dependence, cigarettes, uncomplicated: Secondary | ICD-10-CM | POA: Diagnosis present

## 2024-02-22 DIAGNOSIS — E162 Hypoglycemia, unspecified: Secondary | ICD-10-CM | POA: Diagnosis not present

## 2024-02-22 DIAGNOSIS — Z91199 Patient's noncompliance with other medical treatment and regimen due to unspecified reason: Secondary | ICD-10-CM | POA: Diagnosis not present

## 2024-02-22 DIAGNOSIS — I509 Heart failure, unspecified: Principal | ICD-10-CM

## 2024-02-22 DIAGNOSIS — Z8249 Family history of ischemic heart disease and other diseases of the circulatory system: Secondary | ICD-10-CM

## 2024-02-22 DIAGNOSIS — I5031 Acute diastolic (congestive) heart failure: Secondary | ICD-10-CM | POA: Diagnosis present

## 2024-02-22 DIAGNOSIS — J439 Emphysema, unspecified: Secondary | ICD-10-CM | POA: Diagnosis present

## 2024-02-22 HISTORY — DX: Cor pulmonale (chronic): I27.81

## 2024-02-22 HISTORY — DX: Tobacco use: Z72.0

## 2024-02-22 HISTORY — DX: Chronic obstructive pulmonary disease, unspecified: J44.9

## 2024-02-22 LAB — BLOOD GAS, ARTERIAL
Acid-Base Excess: 6.9 mmol/L — ABNORMAL HIGH (ref 0.0–2.0)
Bicarbonate: 38.1 mmol/L — ABNORMAL HIGH (ref 20.0–28.0)
O2 Saturation: 90.5 %
Patient temperature: 37.1
pCO2 arterial: 89 mmHg (ref 32–48)
pH, Arterial: 7.24 — ABNORMAL LOW (ref 7.35–7.45)
pO2, Arterial: 64 mmHg — ABNORMAL LOW (ref 83–108)

## 2024-02-22 LAB — URINALYSIS, ROUTINE W REFLEX MICROSCOPIC
Bilirubin Urine: NEGATIVE
Glucose, UA: NEGATIVE mg/dL
Ketones, ur: NEGATIVE mg/dL
Leukocytes,Ua: NEGATIVE
Nitrite: NEGATIVE
Protein, ur: NEGATIVE mg/dL
Specific Gravity, Urine: 1.011 (ref 1.005–1.030)
pH: 6 (ref 5.0–8.0)

## 2024-02-22 LAB — TROPONIN T, HIGH SENSITIVITY
Troponin T High Sensitivity: 59 ng/L — ABNORMAL HIGH (ref 0–19)
Troponin T High Sensitivity: 61 ng/L — ABNORMAL HIGH (ref 0–19)

## 2024-02-22 LAB — BLOOD GAS, VENOUS
Acid-Base Excess: 12.1 mmol/L — ABNORMAL HIGH (ref 0.0–2.0)
Bicarbonate: 44.4 mmol/L — ABNORMAL HIGH (ref 20.0–28.0)
Drawn by: 7344
O2 Saturation: 96.1 %
Patient temperature: 37
pCO2, Ven: 106 mmHg (ref 44–60)
pH, Ven: 7.23 — ABNORMAL LOW (ref 7.25–7.43)
pO2, Ven: 86 mmHg — ABNORMAL HIGH (ref 32–45)

## 2024-02-22 LAB — BASIC METABOLIC PANEL WITH GFR
Anion gap: 7 (ref 5–15)
BUN: 23 mg/dL (ref 8–23)
CO2: 34 mmol/L — ABNORMAL HIGH (ref 22–32)
Calcium: 8.5 mg/dL — ABNORMAL LOW (ref 8.9–10.3)
Chloride: 102 mmol/L (ref 98–111)
Creatinine, Ser: 1.02 mg/dL — ABNORMAL HIGH (ref 0.44–1.00)
GFR, Estimated: >60 mL/min
Glucose, Bld: 94 mg/dL (ref 70–99)
Potassium: 4.1 mmol/L (ref 3.5–5.1)
Sodium: 143 mmol/L (ref 135–145)

## 2024-02-22 LAB — T4, FREE: Free T4: 1.06 ng/dL (ref 0.80–2.00)

## 2024-02-22 LAB — MRSA NEXT GEN BY PCR, NASAL: MRSA by PCR Next Gen: NOT DETECTED

## 2024-02-22 LAB — HEPATIC FUNCTION PANEL
ALT: 46 U/L — ABNORMAL HIGH (ref 0–44)
AST: 46 U/L — ABNORMAL HIGH (ref 15–41)
Albumin: 3 g/dL — ABNORMAL LOW (ref 3.5–5.0)
Alkaline Phosphatase: 93 U/L (ref 38–126)
Bilirubin, Direct: 0.1 mg/dL (ref 0.0–0.2)
Indirect Bilirubin: 0.5 mg/dL (ref 0.3–0.9)
Total Bilirubin: 0.6 mg/dL (ref 0.0–1.2)
Total Protein: 5.9 g/dL — ABNORMAL LOW (ref 6.5–8.1)

## 2024-02-22 LAB — TSH: TSH: 1.3 u[IU]/mL (ref 0.350–4.500)

## 2024-02-22 LAB — D-DIMER, QUANTITATIVE: D-Dimer, Quant: 0.94 ug{FEU}/mL — ABNORMAL HIGH (ref 0.00–0.50)

## 2024-02-22 LAB — CBC
HCT: 48.9 % — ABNORMAL HIGH (ref 36.0–46.0)
Hemoglobin: 14.6 g/dL (ref 12.0–15.0)
MCH: 24 pg — ABNORMAL LOW (ref 26.0–34.0)
MCHC: 29.9 g/dL — ABNORMAL LOW (ref 30.0–36.0)
MCV: 80.4 fL (ref 80.0–100.0)
Platelets: 329 K/uL (ref 150–400)
RBC: 6.08 MIL/uL — ABNORMAL HIGH (ref 3.87–5.11)
RDW: 21.2 % — ABNORMAL HIGH (ref 11.5–15.5)
WBC: 8.5 K/uL (ref 4.0–10.5)
nRBC: 0 % (ref 0.0–0.2)

## 2024-02-22 LAB — MAGNESIUM: Magnesium: 1.8 mg/dL (ref 1.7–2.4)

## 2024-02-22 LAB — PRO BRAIN NATRIURETIC PEPTIDE: Pro Brain Natriuretic Peptide: 1948 pg/mL — ABNORMAL HIGH

## 2024-02-22 LAB — HIV ANTIBODY (ROUTINE TESTING W REFLEX): HIV Screen 4th Generation wRfx: NONREACTIVE

## 2024-02-22 MED ORDER — LORAZEPAM 2 MG/ML IJ SOLN
1.0000 mg | Freq: Once | INTRAMUSCULAR | Status: AC | PRN
  Administered 2024-02-23: 1 mg via INTRAVENOUS
  Filled 2024-02-22: qty 1

## 2024-02-22 MED ORDER — FUROSEMIDE 10 MG/ML IJ SOLN
40.0000 mg | Freq: Two times a day (BID) | INTRAMUSCULAR | Status: AC
  Administered 2024-02-22 – 2024-02-23 (×3): 40 mg via INTRAVENOUS
  Filled 2024-02-22 (×3): qty 4

## 2024-02-22 MED ORDER — SODIUM CHLORIDE 0.9% FLUSH
3.0000 mL | Freq: Two times a day (BID) | INTRAVENOUS | Status: DC
  Administered 2024-02-22 – 2024-03-02 (×17): 3 mL via INTRAVENOUS

## 2024-02-22 MED ORDER — BISACODYL 5 MG PO TBEC
5.0000 mg | DELAYED_RELEASE_TABLET | Freq: Every day | ORAL | Status: DC | PRN
  Filled 2024-02-22: qty 1

## 2024-02-22 MED ORDER — HYDRALAZINE HCL 20 MG/ML IJ SOLN: 5.0000 mg | INTRAMUSCULAR | Status: DC | PRN

## 2024-02-22 MED ORDER — NICOTINE 14 MG/24HR TD PT24
14.0000 mg | MEDICATED_PATCH | Freq: Every day | TRANSDERMAL | Status: DC
  Administered 2024-02-22 – 2024-02-27 (×6): 14 mg via TRANSDERMAL
  Filled 2024-02-22 (×9): qty 1

## 2024-02-22 MED ORDER — PANTOPRAZOLE SODIUM 40 MG IV SOLR
40.0000 mg | Freq: Two times a day (BID) | INTRAVENOUS | Status: DC
  Administered 2024-02-22 – 2024-02-23 (×3): 40 mg via INTRAVENOUS
  Filled 2024-02-22 (×3): qty 10

## 2024-02-22 MED ORDER — FUROSEMIDE 10 MG/ML IJ SOLN: 20.0000 mg | Freq: Two times a day (BID) | INTRAMUSCULAR | Status: DC

## 2024-02-22 MED ORDER — HEPARIN SODIUM (PORCINE) 5000 UNIT/ML IJ SOLN
5000.0000 [IU] | Freq: Three times a day (TID) | INTRAMUSCULAR | Status: DC
  Administered 2024-02-22 – 2024-03-02 (×24): 5000 [IU] via SUBCUTANEOUS
  Filled 2024-02-22 (×25): qty 1

## 2024-02-22 MED ORDER — TRAZODONE HCL 50 MG PO TABS
25.0000 mg | ORAL_TABLET | Freq: Every evening | ORAL | Status: DC | PRN
  Administered 2024-02-27 – 2024-03-01 (×2): 25 mg via ORAL
  Filled 2024-02-22 (×3): qty 1

## 2024-02-22 MED ORDER — ALBUTEROL SULFATE (2.5 MG/3ML) 0.083% IN NEBU
3.0000 mL | INHALATION_SOLUTION | Freq: Four times a day (QID) | RESPIRATORY_TRACT | Status: AC
  Administered 2024-02-22 – 2024-02-23 (×2): 3 mL via RESPIRATORY_TRACT
  Filled 2024-02-22 (×2): qty 3

## 2024-02-22 MED ORDER — FUROSEMIDE 10 MG/ML IJ SOLN
40.0000 mg | Freq: Once | INTRAMUSCULAR | Status: AC
  Administered 2024-02-22: 40 mg via INTRAVENOUS
  Filled 2024-02-22: qty 4

## 2024-02-22 MED ORDER — BUDESON-GLYCOPYRROL-FORMOTEROL 160-9-4.8 MCG/ACT IN AERO
2.0000 | INHALATION_SPRAY | Freq: Two times a day (BID) | RESPIRATORY_TRACT | Status: DC
  Filled 2024-02-22: qty 5.9

## 2024-02-22 MED ORDER — ACETAMINOPHEN 650 MG RE SUPP: 650.0000 mg | Freq: Four times a day (QID) | RECTAL | Status: DC | PRN

## 2024-02-22 MED ORDER — ACETAMINOPHEN 325 MG PO TABS
650.0000 mg | ORAL_TABLET | Freq: Four times a day (QID) | ORAL | Status: DC | PRN
  Administered 2024-02-28 – 2024-03-01 (×2): 650 mg via ORAL
  Filled 2024-02-22 (×2): qty 2

## 2024-02-22 MED ORDER — LORAZEPAM 2 MG/ML IJ SOLN
INTRAMUSCULAR | Status: AC
  Administered 2024-02-22: 2 mg
  Filled 2024-02-22: qty 1

## 2024-02-22 NOTE — Consult Note (Signed)
 "  Cardiology Consultation   Patient ID: TYEESHA RIKER MRN: [id number]; DOB: Jul 04, 1960  Admit date: 02/22/2024 Date of Consult: 02/22/2024  PCP:  [facility]   [facility] Providers Cardiologist:  None        Patient Profile: Stephanie Knight is a 64 y.o. female with a hx of COPD/emphysema, morbid obesity (BMI 48.7), tobacco use disorder who is being seen 02/22/2024 for the evaluation of acute on chronic hypoxic respiratory failure concerning for possible heart failure exacerbation at the request of Dr. Tobie.  History of Present Illness: Stephanie Knight states has been dealing with chronic lower extremity swelling for a long time but that it has been worsening for the past few weeks. She denies significant change to her respiratory status over the past few weeks. She notes that she is supposed to be on home oxygen but this has not been set up yet. Unclear what her oxygen requirement would be.   Per chart review, patient has been on Lasix  40mg  daily for chronic lower extremity edema.. She also has chronic exertional dyspnea which has been attributed primarily to her known COPD. She saw her primary pulmonologist, Dr. Theotis, in late December 2025 with concerns of fluid overload in the form of increased peripheral edema. At that time, she wished to attempt outpatient treatment was instructed to increase Lasix  to 40mg  BID. Followed up early January and reported that her breathing was perhaps a bit better but had concerns of rectal bleeding and cough as well. It appears that she has not taken any Lasix  for the past few days. Ultimately, presented as she felt more short of breath than usual and admitted to Hospitalist service.   Labs notable for elevated NT-pro BNP to 1948, D-dimer 0.94, BMP/CBC grossly nonactionable. She is pending a CTA PE study. VBG notable for respiratory acidosis, pH 7.23, with severe hypercarbia to 106.   Past Medical History:  Diagnosis Date   Anemia     with pregnancy    PONV (postoperative nausea and vomiting)     Past Surgical History:  Procedure Laterality Date   ORIF ANKLE FRACTURE Right 05/03/2016   Procedure: OPEN REDUCTION INTERNAL FIXATION RIGHT (ORIF) ANKLE FRACTURE WEIGHT BEARING DISTAL TIBIA WITH FIXATION TIBIA AND FIBULA;  Surgeon: Evalene JONETTA Chancy, MD;  Location: [facility];  Service: Orthopedics;  Laterality: Right;   Right blocked tear duct         Scheduled Meds:  albuterol   3 mL Inhalation QID   budesonide -glycopyrrolate -formoterol   2 puff Inhalation BID   furosemide   40 mg Intravenous Q12H   heparin   5,000 Units Subcutaneous Q8H   nicotine   14 mg Transdermal Daily   pantoprazole  (PROTONIX ) IV  40 mg Intravenous Q12H   sodium chloride  flush  3 mL Intravenous Q12H   Continuous Infusions:  PRN Meds: acetaminophen  **OR** acetaminophen , bisacodyl , hydrALAZINE , traZODone   Allergies:   Allergies[1]  Social History:   Social History   Socioeconomic History   Marital status: Single    Spouse name: Not on file   Number of children: Not on file   Years of education: Not on file   Highest education level: Some college, no degree  Occupational History   Not on file  Tobacco Use   Smoking status: Every Day    Current packs/day: 0.50    Average packs/day: 0.5 packs/day for 44.0 years (22.0 ttl pk-yrs)    Types: Cigarettes   Smokeless tobacco: Never  Vaping Use   Vaping status:  Never Used  Substance and Sexual Activity   Alcohol use: Yes    Comment: Socially   Drug use: Not Currently    Types: Marijuana    Comment: 10 years ago   Sexual activity: Yes    Birth control/protection: None, Condom  Other Topics Concern   Not on file  Social History Narrative   Not on file   Social Drivers of Health   Tobacco Use: High Risk (02/18/2024)   Received from [facility]   Patient History    Smoking Tobacco Use: Every Day    Smokeless Tobacco Use: Never    Passive Exposure: Not  on file  Financial Resource Strain: Low Risk  (01/16/2023)   Received [facility]   Overall Financial Resource Strain (CARDIA)    Difficulty of Paying Living Expenses: Not hard at all  Food Insecurity: Low Risk (07/15/2023)   Received from [facility]   Epic    Within the past 12 months, you worried that your food would run out before you got money to buy more: Never true    Within the past 12 months, the food you bought just didn't last and you didn't have money to get more. : Never true  Transportation Needs: No Transportation Needs (07/15/2023)   Received from [facility]    In the past 12 months, has lack of reliable transportation kept you from medical appointments, meetings, work or from getting things needed for daily living? : No  Physical Activity: Not on file  Stress: Not on file  Social Connections: Not on file  Intimate Partner Violence: Not on file  Depression (EYV7-0): Not on file  Alcohol Screen: Not on file  Housing: Low Risk (07/15/2023)   Received from [facility]   Epic    What is your living situation today?: I have a steady place to live    Think about the place you live. Do you have problems with any of the following? Choose all that apply:: None/None on this list  Utilities: Low Risk (07/15/2023)   Received from [facility]   Utilities    In the past 12 months has the electric, gas, oil, or water  company threatened to shut off services in your home? : No  Health Literacy: Not on file    Family History:   Reviewed, noncontributory Family History  Problem Relation Age of Onset   Hypertension Mother    Dementia Mother    Hypertension Father    Hypertension Sister    Colon cancer Neg Hx    Colon polyps Neg Hx    Esophageal cancer Neg Hx    Rectal cancer Neg Hx    Stomach cancer Neg Hx      ROS:  Please see the history of present illness.   All other ROS reviewed and negative.     Physical Exam/Data: Vitals:    02/22/24 1430 02/22/24 1600 02/22/24 1816 02/22/24 1929  BP: 103/89 122/77 118/68 135/86  Pulse: 78 75  90  Resp: (!) 27 (!) 32 17 20  Temp:   99.2 F (37.3 C) 98.7 F (37.1 C)  TempSrc:   Oral Oral  SpO2: 95% 97% 94% 91%  Weight:   (!) 141.1 kg   Height:   5' 7 (1.702 m)     Intake/Output Summary (Last 24 hours) at 02/22/2024 1947 Last data filed at 02/22/2024 1900 Gross per 24 hour  Intake 120 ml  Output 1200 ml  Net -1080 ml      02/22/2024    6:16 PM 01/30/2022   11:22 AM 11/14/2021    6:41 AM  Last 3 Weights  Weight (lbs) 311 lb 291 lb 9.6 oz 300 lb  Weight (kg) 141.069 kg 132.269 kg 136.079 kg     Body mass index is 48.71 kg/m.  General:  Well nourished, well developed, in no acute distress, morbidly obese HEENT: normal Neck: no JVD Vascular: No carotid bruits; Distal pulses 2+ bilaterally Cardiac:  normal S1, S2; RRR; no murmur Lungs:  Diminished breath sounds in bilateral lower lobes, dull wheeze Abd: soft, nontender, no hepatomegaly  Ext: no edema Musculoskeletal:  No deformities, BUE and BLE strength normal and equal Skin: warm and dry  Neuro:  CNs 2-12 intact, no focal abnormalities noted Psych:  Normal affect   EKG:  The EKG was personally reviewed and demonstrates:  Sinus rhythm, HR 83bpm. Nonspecific ST/T changes.   Telemetry:  Telemetry was personally reviewed and demonstrates:  Sinus rhythm without significant abnormal features.   Relevant CV Studies:  CXR 02/22/24:  IMPRESSION: 1. Cardiomegaly with moderate, asymmetric right congestive heart failure.  TTE 09/06/22 (OSH): INTERPRETATION  NORMAL LEFT VENTRICULAR SYSTOLIC FUNCTION (LVEF >55%) NORMAL RIGHT VENTRICULAR SYSTOLIC FUNCTION  NO VALVULAR STENOSIS  Normal right heart pressures   Laboratory Data: High Sensitivity Troponin:  No results for input(s): TROPONINIHS in the last 720 hours. No results for input(s): TRNPT in the last 720 hours.    Chemistry Recent Labs  Lab  02/22/24 1357  NA 143  K 4.1  CL 102  CO2 34*  GLUCOSE 94  BUN 23  CREATININE 1.02*  CALCIUM 8.5*  GFRNONAA >60  ANIONGAP 7    No results for input(s): PROT, ALBUMIN , AST, ALT, ALKPHOS, BILITOT in the last 168 hours. Lipids No results for input(s): CHOL, TRIG, HDL, LABVLDL, LDLCALC, CHOLHDL in the last 168 hours.  Hematology Recent Labs  Lab 02/22/24 1357  WBC 8.5  RBC 6.08*  HGB 14.6  HCT 48.9*  MCV 80.4  MCH 24.0*  MCHC 29.9*  RDW 21.2*  PLT 329   Thyroid No results for input(s): TSH, FREET4 in the last 168 hours.  BNP Recent Labs  Lab 02/22/24 1357  PROBNP 1,948.0*    DDimer No results for input(s): DDIMER in the last 168 hours.  Radiology/Studies:  DG Chest 2 View Result Date: 02/22/2024 EXAM: 2 VIEW(S) XRAY OF THE CHEST 02/22/2024 01:52:24 PM COMPARISON: 07/16/2023 from [facility]. CLINICAL HISTORY: Shortness of breath. FINDINGS: LINES, TUBES AND DEVICES: Multiple wires and leads project over the chest on the frontal radiograph. LUNGS AND PLEURA: Interstitial and airspace disease is greater right than left and lower lung predominant. No pleural effusion. No pneumothorax. HEART AND MEDIASTINUM: Moderate cardiomegaly. BONES AND SOFT TISSUES: No acute osseous abnormality. LIMITATIONS/ARTIFACTS: The lateral view is moderately degraded by patient size and arm position. Patient rotated left on the frontal. IMPRESSION: 1. Cardiomegaly with moderate, asymmetric right congestive heart failure. Electronically signed by: Rockey Kilts MD MD 02/22/2024 02:15 PM EST RP Workstation: [id number]   Assessment and Plan: Acute hypoxic, hypercarbic respiratory failure, possibly 2/2 PH vs. 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  Presentation likely multifactorial, though symptoms appear largely R sided in nature so differential diagnosis should include Group 3 pulmonary hypertension (PH) from her COPD as well  as heart failure exacerbation. I also suspect a degree of COPD exacerbation involved as well given her significant hypercarbia and respiratory acidosis on VBG.    Plan - TTE in AM. Given known history of advanced COPD/emphysema, if pulmonary pressures appear elevated on TTE I would query possible Group 3 PH vs. HFpEF - Agree with diuresis per primary team. K>4, Mg>2 while diuresing. Goal I/O net negative 1-1.5L / 24 hours. Strict I/O, daily standing weights - Prior to discharge, if most likely unifying clinical diagnosis is HFpEF then consider initiation of Jardiance, spironolactone - Defer consideration of empiric COPD exacerbation treatment to primary team, hypercarbia likely out of proportion to degree of volume overload even accounting for chronic COPD - Should obtain sleep study for OSA evaluation  Risk Assessment/Risk Scores:  [place]  Heart Association (NYHA) Functional Class NYHA Class III   For questions or updates, please contact [facility] Please consult [email or web address] for contact info under   Signed, Franky GORMAN Earthly, MD  02/22/2024 7:47 PM     [1]  Allergies Allergen Reactions   Aspirin  Nausea And Vomiting   "

## 2024-02-22 NOTE — Progress Notes (Signed)
 Lab called with critical VBG pC02 of 106, MD notified and BIPAP was ordered. When respiratory came up with Bipap patient became anxious and stated No, I can't do it. And was trying to pull off BIPAP. MD notified again of anxiety with BIPAP and a 1 time order for IV ativan  given.

## 2024-02-22 NOTE — H&P (Signed)
 " History and Physical    Patient: Stephanie Knight FMW:[id number] DOB: 1960-04-26 DOA: 02/22/2024 DOS: the patient was seen and examined on 02/22/2024 . PCP: [facility]  Patient coming from: [facility]. Chief complaint: Chief Complaint  Patient presents with   Edema   HPI:  Stephanie Knight is a 64 y.o. female with past medical history  of severe COPD, tobacco abuse, rectal bleeding sent from [facility] for lower extremity edema and CHF evaluation, has no prior diagnosis of CHF and was reported to be hypoxic on 5 L. Patient had a normal echocardiogram on September 06, 2022 performed at [facility] with normal left and right ventricular systolic function no valvular stenosis and normal right heart pressures at that time.  Patient is chest CT done from lung cancer screening which showed an enlarged pulmonary trunk measuring 3.7 cm and possibility of pulmonary hypertension.  Patient also seen by her pulmonologist on 6 January Dr. Theotis at which time she reported ongoing rectal bleeding along with shortness of breath and cough.  Urinalysis collected last week showed E. coli that was essentially pansensitive for which patient was given Cipro for 7 days.  Patient was started on Lasix  by pulmonology and is supposed to be on home O2 and has not been able to start that yet.   ED Course:  Vital signs in the ED were notable for the following:  Vitals:   02/22/24 1430 02/22/24 1600 02/22/24 1816 02/22/24 1929  BP: 103/89 122/77 118/68 135/86  Pulse: 78 75  90  Temp:   99.2 F (37.3 C) 98.7 F (37.1 C)  Resp: (!) 27 (!) 32 17 20  Height:   5' 7 (1.702 m)   Weight:   (!) 141.1 kg   SpO2: 95% 97% 94% 91%  TempSrc:   Oral Oral  BMI (Calculated):   48.7    >> [facility] ED evaluation thus far shows: Basic metabolic panel showing bicarb of 34 AKI with a creatinine of 1.02 eGFR more than 60. proBNP of 1948. CBC showing white count of 8.5 hemoglobin of 14.6 and platelets of 329. Chest x-ray  done today shows cardiomegaly with moderate asymmetric right congestive heart failure.  >>While in the ED patient received the following: Medications  furosemide  (LASIX ) injection 40 mg (40 mg Intravenous Given 02/22/24 1408)   Review of Systems  Respiratory:  Positive for shortness of breath.   Cardiovascular:  Positive for leg swelling.   Past Medical History:  Diagnosis Date   Anemia    with pregnancy    PONV (postoperative nausea and vomiting)    Past Surgical History:  Procedure Laterality Date   ORIF ANKLE FRACTURE Right 05/03/2016   Procedure: OPEN REDUCTION INTERNAL FIXATION RIGHT (ORIF) ANKLE FRACTURE WEIGHT BEARING DISTAL TIBIA WITH FIXATION TIBIA AND FIBULA;  Surgeon: Evalene JONETTA Chancy, MD;  Location: [facility];  Service: Orthopedics;  Laterality: Right;   Right blocked tear duct      reports that she has been smoking cigarettes. She has a 22 pack-year smoking history. She has never used smokeless tobacco. She reports current alcohol use. She reports that she does not currently use drugs after having used the following drugs: Marijuana. Allergies[1] Family History  Problem Relation Age of Onset   Hypertension Mother    Dementia Mother    Hypertension Father    Hypertension Sister    Colon cancer Neg Hx    Colon polyps Neg Hx    Esophageal cancer Neg Hx  Rectal cancer Neg Hx    Stomach cancer Neg Hx    Prior to Admission medications  Medication Sig Start Date End Date Taking? Authorizing Provider  meclizine  (ANTIVERT ) 12.5 MG tablet Take 1 tablet (12.5 mg total) by mouth 3 (three) times daily as needed for dizziness. 01/30/22   Rush Nest, MD  Naproxen Sodium (ALEVE) 220 MG CAPS Take 2 capsules by mouth daily as needed (pain). Patient not taking: Reported on 01/30/2022    [provider]                                                                                 Vitals:   02/22/24 1430 02/22/24 1600 02/22/24 1816 02/22/24 1929   BP: 103/89 122/77 118/68 135/86  Pulse: 78 75  90  Resp: (!) 27 (!) 32 17 20  Temp:   99.2 F (37.3 C) 98.7 F (37.1 C)  TempSrc:   Oral Oral  SpO2: 95% 97% 94% 91%  Weight:   (!) 141.1 kg   Height:   5' 7 (1.702 m)    Physical Exam Vitals reviewed.  Constitutional:      General: She is not in acute distress.    Appearance: She is obese. She is not ill-appearing.  HENT:     Head: Normocephalic and atraumatic.  Eyes:     Extraocular Movements: Extraocular movements intact.  Cardiovascular:     Rate and Rhythm: Normal rate and regular rhythm.     Pulses: Normal pulses.          Dorsalis pedis pulses are 2+ on the right side and 2+ on the left side.       Posterior tibial pulses are 2+ on the right side and 2+ on the left side.     Heart sounds: Normal heart sounds.  Pulmonary:     Breath sounds: Rales present.  Abdominal:     General: There is no distension.     Palpations: Abdomen is soft.     Tenderness: There is no abdominal tenderness.  Musculoskeletal:     Right lower leg: Edema present.     Left lower leg: Edema present.  Neurological:     General: No focal deficit present.     Mental Status: She is oriented to person, place, and time.     Comments: But somnolent.   Psychiatric:        Speech: Speech normal.     Labs on Admission: I have personally reviewed following labs and imaging studies CBC: Recent Labs  Lab 02/22/24 1357  WBC 8.5  HGB 14.6  HCT 48.9*  MCV 80.4  PLT 329   Basic Metabolic Panel: Recent Labs  Lab 02/22/24 1357  NA 143  K 4.1  CL 102  CO2 34*  GLUCOSE 94  BUN 23  CREATININE 1.02*  CALCIUM 8.5*   GFR: Estimated Creatinine Clearance: 80 mL/min (A) (by C-G formula based on SCr of 1.02 mg/dL (H)). Liver Function Tests: No results for input(s): AST, ALT, ALKPHOS, BILITOT, PROT, ALBUMIN  in the last 168 hours. No results for input(s): LIPASE, AMYLASE in the last 168 hours. No results for input(s): AMMONIA in  the last 168 hours.  Recent Labs    02/22/24 1357  BUN 23  CREATININE 1.02*    Cardiac Enzymes: No results for input(s): CKTOTAL, CKMB, CKMBINDEX, TROPONINI in the last 168 hours. BNP (last 3 results) Recent Labs    02/22/24 1357  PROBNP 1,948.0*   HbA1C: No results for input(s): HGBA1C in the last 72 hours. CBG: No results for input(s): GLUCAP in the last 168 hours. Lipid Profile: No results for input(s): CHOL, HDL, LDLCALC, TRIG, CHOLHDL, LDLDIRECT in the last 72 hours. Thyroid Function Tests: No results for input(s): TSH, T4TOTAL, FREET4, T3FREE, THYROIDAB in the last 72 hours. Anemia Panel: No results for input(s): VITAMINB12, FOLATE, FERRITIN, TIBC, IRON, RETICCTPCT in the last 72 hours. Urine analysis:    Component Value Date/Time   COLORURINE YELLOW 11/14/2021 1440   APPEARANCEUR CLOUDY (A) 11/14/2021 1440   APPEARANCEUR Turbid (A) 12/14/2020 1710   LABSPEC 1.016 11/14/2021 1440   PHURINE 6.0 11/14/2021 1440   GLUCOSEU NEGATIVE 11/14/2021 1440   HGBUR SMALL (A) 11/14/2021 1440   BILIRUBINUR NEGATIVE 11/14/2021 1440   BILIRUBINUR Negative 12/14/2020 1710   KETONESUR NEGATIVE 11/14/2021 1440   PROTEINUR NEGATIVE 11/14/2021 1440   UROBILINOGEN 2.0 (A) 10/07/2019 1522   NITRITE POSITIVE (A) 11/14/2021 1440   LEUKOCYTESUR MODERATE (A) 11/14/2021 1440   Radiological Exams on Admission: DG Chest 2 View Result Date: 02/22/2024 EXAM: 2 VIEW(S) XRAY OF THE CHEST 02/22/2024 01:52:24 PM COMPARISON: [facility]16/2023 [facility]. CLINICAL HISTORY: Shortness of breath. FINDINGS: LINES, TUBES AND DEVICES: Multiple wires and leads project over the chest on the frontal radiograph. LUNGS AND PLEURA: Interstitial and airspace disease is greater right than left and lower lung predominant. No pleural effusion. No pneumothorax. HEART AND MEDIASTINUM: Moderate cardiomegaly. BONES AND SOFT TISSUES: No acute osseous abnormality.  LIMITATIONS/ARTIFACTS: The lateral view is moderately degraded by patient size and arm position. Patient rotated left on the frontal. IMPRESSION: 1. Cardiomegaly with moderate, asymmetric right congestive heart failure. Electronically signed by: Rockey Kilts MD MD 02/22/2024 02:15 PM EST RP Workstation: [id number]   Data Reviewed: Relevant notes from primary care and specialist visits, past discharge summaries as available in EHR, including Care Everywhere . Prior diagnostic testing as pertinent to current admission diagnoses, Updated medications and problem lists for reconciliation .ED course, including vitals, labs, imaging, treatment and response to treatment,Triage notes, nursing and pharmacy notes and ED provider's notes.Notable results as noted in HPI.Discussed case with EDMD/ ED APP/ or Specialty MD on call and as needed.  Assessment & Plan  >> Lower extremity edema/ SOB: >> Suspect acute decompensated congestive heart failure: - With patient's elevated proBNP in the setting of shortness of breath and lower extremity edema it is concerning for development of new onset congestive heart failure. -Patient received Lasix  40 mg earlier and will continue with Lasix  20 mg IV twice daily x 3 doses strict I's and O's Daily weight fluid restriction.2D echocardiogram and cardiology consult. Will admit to continuous cardiac monitoring and pulse oximetry.  >> Suspected pulmonary hypertension: Enlarged pulmonary artery based on her recent chest CT is concerning for pulmonary hypertension and with her presentation of asymmetric  pattern on chest x-ray is concerning for same again will obtain a 2D echocardiogram. we will proceed with CTA chest PE rule out as well.  >> Shortness of breath: Supplemental oxygen as deemed appropriate and needed.  >> Tobacco abuse/COPD: Nicotine  patch, as needed albuterol .  >> Morbid obesity with a BMI of 48.71: Will evaluate for diabetes mellitus type 2 along with TFTs lipid  panel.  Patient will need outpatient sleep evaluation to rule out OSA.  Will eval the rate for hypercapnia due to her somnolence suspect patient may have pickwickian syndrome.  >>Rectal bleeding: Monitor patient's CBC, stool occult, IV PPI. GI consult as deemed appropriate.   >>AKI: Lab Results  Component Value Date   CREATININE 1.02 (H) 02/22/2024   CREATININE 0.93 11/14/2021   CREATININE 0.82 04/08/2019  Will monitor renally dose and avoid contrast and nephrotoxic agents and meds.    DVT prophylaxis:  Heparin .   Consults:  Cardiology.   Advance Care Planning:    Code Status: Full Code   Family Communication:  None.   Disposition Plan:  Home.   Severity of Illness: The appropriate patient status for this patient is INPATIENT. Inpatient status is judged to be reasonable and necessary in order to provide the required intensity of service to ensure the patient's safety. The patient's presenting symptoms, physical exam findings, and initial radiographic and laboratory data in the context of their chronic comorbidities is felt to place them at high risk for further clinical deterioration. Furthermore, it is not anticipated that the patient will be medically stable for discharge from the hospital within 2 midnights of admission.   * I certify that at the point of admission it is my clinical judgment that the patient will require inpatient hospital care spanning beyond 2 midnights from the point of admission due to high intensity of service, high risk for further deterioration and high frequency of surveillance required.*  Unresulted Labs (From admission, onward)     Start     Ordered   02/23/24 0500  CBC with Differential/Platelet  Tomorrow morning,   R        02/22/24 1847   02/23/24 0500  Comprehensive metabolic panel with GFR  Tomorrow morning,   R        02/22/24 1847   02/23/24 0500  Magnesium   Tomorrow morning,   R        02/22/24 1847   02/23/24 0500  Phosphorus   Tomorrow morning,   R        02/22/24 1847   02/23/24 0500  Occult blood card to lab, stool RN will collect  Daily,   R     Question:  Specimen to be collected by:  Answer:  RN will collect   02/22/24 1902   02/22/24 1934  Blood gas, venous  ONCE - STAT,   STAT        02/22/24 1933   02/22/24 1850  T4, free  Once,   R        02/22/24 1850   02/22/24 1850  TSH  Once,   R        02/22/24 1850   02/22/24 1848  HIV Antibody (routine testing w rflx)  (HIV Antibody (Routine testing w reflex) panel)  Once,   R        02/22/24 1850   02/22/24 1847  Hepatic function panel  ONCE - URGENT,   URGENT        02/22/24 1847   02/22/24 1847  D-dimer, quantitative  ONCE - STAT,   STAT        02/22/24 1847   02/22/24 1847  Urinalysis, Routine w reflex microscopic -Urine, Clean Catch  ONCE - URGENT,   URGENT       Question:  Specimen Source  Answer:  Urine, Clean Catch   02/22/24 1847   02/22/24 1847  Magnesium   ONCE -  STAT,   STAT        02/22/24 1847   02/22/24 1818  MRSA Next Gen by PCR, Nasal  Once,   R        02/22/24 1817           Meds ordered this encounter  Medications   furosemide  (LASIX ) injection 40 mg   furosemide  (LASIX ) injection 20 mg   sodium chloride  flush (NS) 0.9 % injection 3 mL   OR Linked Order Group    acetaminophen  (TYLENOL ) tablet 650 mg    acetaminophen  (TYLENOL ) suppository 650 mg   traZODone  (DESYREL ) tablet 25 mg   bisacodyl  (DULCOLAX) EC tablet 5 mg   nicotine  (NICODERM CQ  - dosed in mg/24 hours) patch 14 mg   hydrALAZINE  (APRESOLINE ) injection 5 mg   heparin  injection 5,000 Units   pantoprazole  (PROTONIX ) injection 40 mg   Orders Placed This Encounter  Procedures   Critical Care   MRSA Next Gen by PCR, Nasal   DG Chest 2 View   CT Angio Chest PE W and/or Wo Contrast   Basic metabolic panel   CBC   Pro Brain natriuretic peptide   Hepatic function panel   D-dimer, quantitative   CBC with Differential/Platelet   Comprehensive metabolic panel with GFR    Magnesium    Phosphorus   Urinalysis, Routine w reflex microscopic -Urine, Clean Catch   Magnesium    HIV Antibody (routine testing w rflx)   T4, free   TSH   Occult blood card to lab, stool RN will collect   Blood gas, venous   Diet Heart Room service appropriate? Yes; Fluid consistency: Thin; Fluid restriction: 1500 mL Fluid   Document Height and Actual Weight   If O2 Sat <94% administer O2 at 2 liters/minute via nasal cannula   ED Cardiac monitoring   Notify physician (specify)   Initiate Heart Failure Care Plan   Daily weights   Strict intake and output   In and Out Cath   Patient Education:   Apply Heart Failure Care Plan   [facility] and [facility] only) Obtain REDS clips reading Every morning   Patient has an active order for admit to inpatient/place in observation   Maintain IV access   Vital signs   Notify physician (specify)   Refer to Sidebar Report Mobility Protocol for Adult Inpatient   Initiate Adult Central Line Maintenance and Catheter Clearance Protocol for patients with central line (CVC, PICC, Port, Hemodialysis, Trialysis)   If patient diabetic or glucose greater than 140 notify physician for Sliding Scale Insulin Orders   Initiate CHG Protocol for patients in ICU/SD or any patient with a central line or foley catheter   Do not place and if present remove PureWick   Initiate Oral Care Protocol   Initiate Carrier Fluid Protocol   RN may order General Admission PRN Orders utilizing General Admission PRN medications (through manage orders) for the following patient needs: allergy symptoms (Claritin), cold sores (Carmex), cough (Robitussin DM), eye irritation (Liquifilm Tears), hemorrhoids (Tucks), indigestion (Maalox), minor skin irritation (Hydrocortisone Cream), muscle pain (Ben Gay), nose irritation (saline nasal spray) and sore throat (Chloraseptic spray).   Cardiac Monitoring Continuous x 48 hours Indications for use: Other; Other indications for use: chf   Ambulate with  assistance   Full code   Consult to hospitalist   Inpatient consult to Cardiology Consult Timeframe: ROUTINE - requires response within 24 hours; Reason for Consult? CHF Already called   Consult to Transition of Care Team  Consult to Heart Failure Navigation Team ([facility], [facility], and [facility])   Nutritional services consult   OT eval and treat   PT eval and treat   Pulse oximetry check with vital signs   Oxygen therapy Mode or (Route): Nasal cannula; Liters Per Minute: 2; Keep O2 saturation between: greater than 92 %   Incentive spirometry   Pulse oximetry, continuous   ED EKG   EKG 12-Lead   EKG   EKG   EKG   EKG   ECHOCARDIOGRAM COMPLETE   Saline lock IV   Insert peripheral IV   Admit to Inpatient (patient's expected length of stay will be greater than 2 midnights or inpatient only procedure)   Aspiration precautions   Fall precautions   Author: Mario LULLA Blanch, MD 12 pm- 8 pm. [facility]. 02/22/2024 7:35 PM Please note for any communication after hours contact [facility] Assigned provider on call on Amion.     [1]  Allergies Allergen Reactions   Aspirin  Nausea And Vomiting   "

## 2024-02-22 NOTE — ED Provider Notes (Signed)
 " [place] EMERGENCY DEPARTMENT AT [facility] Provider Note   CSN: [id number] Arrival date & time: 02/22/24  1249     Patient presents with: Edema   Stephanie Knight is a 64 y.o. female.  With a history of COPD and obesity who presents to the ED for shortness of breath.  Increasing shortness of breath and peripheral edema on the lower extremities for the last 3 weeks.  Takes Lasix  at home last dose was 5 days ago.  Is supposed to be on home oxygen but this is not in place yet due to insurance issues.  No chest pain fevers chills recent illness.  No formal diagnosis of heart failure per patient    HPI     Prior to Admission medications  Medication Sig Start Date End Date Taking? Authorizing Provider  meclizine  (ANTIVERT ) 12.5 MG tablet Take 1 tablet (12.5 mg total) by mouth 3 (three) times daily as needed for dizziness. 01/30/22   Rush Nest, MD  Naproxen Sodium (ALEVE) 220 MG CAPS Take 2 capsules by mouth daily as needed (pain). Patient not taking: Reported on 01/30/2022    [provider]    Allergies: Aspirin     Review of Systems  Updated Vital Signs BP 103/89   Pulse 78   Temp 97.7 F (36.5 C)   Resp (!) 27   SpO2 95%   Physical Exam Vitals and nursing note reviewed.  HENT:     Head: Normocephalic and atraumatic.  Eyes:     Pupils: Pupils are equal, round, and reactive to light.  Cardiovascular:     Rate and Rhythm: Normal rate and regular rhythm.  Pulmonary:     Effort: Pulmonary effort is normal.     Breath sounds: Rales present. No wheezing.  Abdominal:     Palpations: Abdomen is soft.     Tenderness: There is no abdominal tenderness.  Musculoskeletal:     Right lower leg: Edema present.     Left lower leg: Edema present.  Skin:    General: Skin is warm and dry.  Neurological:     Mental Status: She is alert.  Psychiatric:        Mood and Affect: Mood normal.     (all labs ordered are listed, but only abnormal results are  displayed) Labs Reviewed  BASIC METABOLIC PANEL WITH GFR - Abnormal; Notable for the following components:      Result Value   CO2 34 (*)    Creatinine, Ser 1.02 (*)    Calcium 8.5 (*)    All other components within normal limits  CBC - Abnormal; Notable for the following components:   RBC 6.08 (*)    HCT 48.9 (*)    MCH 24.0 (*)    MCHC 29.9 (*)    RDW 21.2 (*)    All other components within normal limits  PRO BRAIN NATRIURETIC PEPTIDE - Abnormal; Notable for the following components:   Pro Brain Natriuretic Peptide 1,948.0 (*)    All other components within normal limits    EKG: None  Radiology: DG Chest 2 View Result Date: 02/22/2024 EXAM: 2 VIEW(S) XRAY OF THE CHEST 02/22/2024 01:52:24 PM COMPARISON: 07/16/2023 from [facility]. CLINICAL HISTORY: Shortness of breath. FINDINGS: LINES, TUBES AND DEVICES: Multiple wires and leads project over the chest on the frontal radiograph. LUNGS AND PLEURA: Interstitial and airspace disease is greater right than left and lower lung predominant. No pleural effusion. No pneumothorax. HEART AND MEDIASTINUM: Moderate cardiomegaly. BONES  AND SOFT TISSUES: No acute osseous abnormality. LIMITATIONS/ARTIFACTS: The lateral view is moderately degraded by patient size and arm position. Patient rotated left on the frontal. IMPRESSION: 1. Cardiomegaly with moderate, asymmetric right congestive heart failure. Electronically signed by: Rockey Kilts MD MD 02/22/2024 02:15 PM EST RP Workstation: [id number]     .Critical Care  Performed by: Pamella Ozell LABOR, DO Authorized by: Pamella Ozell LABOR, DO   Critical care provider statement:    Critical care time (minutes):  30   Critical care was necessary to treat or prevent imminent or life-threatening deterioration of the following conditions:  Respiratory failure   Critical care was time spent personally by me on the following activities:  Development of treatment plan with patient or surrogate,  discussions with consultants, evaluation of patient's response to treatment, examination of patient, ordering and review of laboratory studies, ordering and review of radiographic studies, ordering and performing treatments and interventions, pulse oximetry, re-evaluation of patient's condition, review of old charts and obtaining history from patient or surrogate   I assumed direction of critical care for this patient from another provider in my specialty: no     Care discussed with: admitting provider      Medications Ordered in the ED  furosemide  (LASIX ) injection 40 mg (40 mg Intravenous Given 02/22/24 1408)    Clinical Course as of 02/22/24 1516  Sat Feb 22, 2024  1515 Chest x-ray shows pulmonary edema BNP of 2000 consistent with acute heart failure.  Stable on 5 L nasal cannula.  Discussed with Dr. Fausto admitting hospitalist accepts patient for admission. [MP]    Clinical Course User Index [MP] Pamella Ozell LABOR, DO                                 Medical Decision Making 64 year old female with history as above presented to the ED for increasing peripheral edema shortness of breath.  Noted to be hypoxic on room air in the low 80s here.  Improvement with supplemental oxygen up titrating from 2 L to 5 L.  Afebrile.  Presentation most concerning for new onset heart failure and perhaps mixed picture with COPD exacerbation although no wheezing on my exam.  Will obtain chest x-ray laboratory workup EKG and give dose of Lasix  here.  Amount and/or Complexity of Data Reviewed Labs: ordered. Radiology: ordered.  Risk Prescription drug management. Decision regarding hospitalization.        Final diagnoses:  Acute heart failure, unspecified heart failure type (HCC)  Acute hypoxic respiratory failure [facility])    ED Discharge Orders     None          Pamella Ozell LABOR, DO 02/22/24 1516  "

## 2024-02-22 NOTE — ED Notes (Signed)
 [facility] at [facility] called for transport 16:56-TC

## 2024-02-22 NOTE — ED Notes (Signed)
 Oxygen increased to 5 lpm [place] per verbal order by Dr Pamella.

## 2024-02-22 NOTE — ED Triage Notes (Addendum)
 Patient states edema in legs and feet for the past 3 weeks. States received chest xray and was put on lasix  by pulmonology. Swelling has not improved per patient. Supposed to be on home o2 but hasn't been able to get it yet. O2 80% on room air.

## 2024-02-23 ENCOUNTER — Encounter (HOSPITAL_COMMUNITY): Payer: Self-pay | Admitting: Internal Medicine

## 2024-02-23 ENCOUNTER — Inpatient Hospital Stay (HOSPITAL_COMMUNITY)

## 2024-02-23 DIAGNOSIS — G9340 Encephalopathy, unspecified: Secondary | ICD-10-CM

## 2024-02-23 DIAGNOSIS — J449 Chronic obstructive pulmonary disease, unspecified: Secondary | ICD-10-CM

## 2024-02-23 DIAGNOSIS — I5031 Acute diastolic (congestive) heart failure: Secondary | ICD-10-CM

## 2024-02-23 DIAGNOSIS — I2781 Cor pulmonale (chronic): Secondary | ICD-10-CM

## 2024-02-23 DIAGNOSIS — D751 Secondary polycythemia: Secondary | ICD-10-CM

## 2024-02-23 DIAGNOSIS — I5021 Acute systolic (congestive) heart failure: Secondary | ICD-10-CM

## 2024-02-23 LAB — COMPREHENSIVE METABOLIC PANEL WITH GFR
ALT: 41 U/L (ref 0–44)
AST: 53 U/L — ABNORMAL HIGH (ref 15–41)
Albumin: 2.9 g/dL — ABNORMAL LOW (ref 3.5–5.0)
Alkaline Phosphatase: 81 U/L (ref 38–126)
Anion gap: 8 (ref 5–15)
BUN: 19 mg/dL (ref 8–23)
CO2: 34 mmol/L — ABNORMAL HIGH (ref 22–32)
Calcium: 8.3 mg/dL — ABNORMAL LOW (ref 8.9–10.3)
Chloride: 99 mmol/L (ref 98–111)
Creatinine, Ser: 0.95 mg/dL (ref 0.44–1.00)
GFR, Estimated: >60 mL/min
Glucose, Bld: 97 mg/dL (ref 70–99)
Potassium: 4.8 mmol/L (ref 3.5–5.1)
Sodium: 140 mmol/L (ref 135–145)
Total Bilirubin: 0.7 mg/dL (ref 0.0–1.2)
Total Protein: 5.6 g/dL — ABNORMAL LOW (ref 6.5–8.1)

## 2024-02-23 LAB — ECHOCARDIOGRAM COMPLETE
AR max vel: 2.31 cm2
AV Area VTI: 2.16 cm2
AV Area mean vel: 2.12 cm2
AV Mean grad: 6 mmHg
AV Peak grad: 11.2 mmHg
Ao pk vel: 1.67 m/s
Area-P 1/2: 3.65 cm2
Calc EF: 49.2 %
Height: 67 in
MV VTI: 2.54 cm2
S' Lateral: 3.1 cm
Single Plane A2C EF: 26.8 %
Single Plane A4C EF: 66.5 %
Weight: 5164.06 [oz_av]

## 2024-02-23 LAB — CBC WITH DIFFERENTIAL/PLATELET
Abs Immature Granulocytes: 0.06 K/uL (ref 0.00–0.07)
Basophils Absolute: 0 K/uL (ref 0.0–0.1)
Basophils Relative: 1 %
Eosinophils Absolute: 0.1 K/uL (ref 0.0–0.5)
Eosinophils Relative: 1 %
HCT: 48.7 % — ABNORMAL HIGH (ref 36.0–46.0)
Hemoglobin: 14.1 g/dL (ref 12.0–15.0)
Immature Granulocytes: 1 %
Lymphocytes Relative: 20 %
Lymphs Abs: 1.6 K/uL (ref 0.7–4.0)
MCH: 24 pg — ABNORMAL LOW (ref 26.0–34.0)
MCHC: 29 g/dL — ABNORMAL LOW (ref 30.0–36.0)
MCV: 82.8 fL (ref 80.0–100.0)
Monocytes Absolute: 0.9 K/uL (ref 0.1–1.0)
Monocytes Relative: 11 %
Neutro Abs: 5.5 K/uL (ref 1.7–7.7)
Neutrophils Relative %: 66 %
Platelets: 297 K/uL (ref 150–400)
RBC: 5.88 MIL/uL — ABNORMAL HIGH (ref 3.87–5.11)
RDW: 20.8 % — ABNORMAL HIGH (ref 11.5–15.5)
WBC: 8.3 K/uL (ref 4.0–10.5)
nRBC: 0.2 % (ref 0.0–0.2)

## 2024-02-23 LAB — PHOSPHORUS: Phosphorus: 4.8 mg/dL — ABNORMAL HIGH (ref 2.5–4.6)

## 2024-02-23 LAB — BLOOD GAS, VENOUS
Acid-Base Excess: 10.5 mmol/L — ABNORMAL HIGH (ref 0.0–2.0)
Bicarbonate: 42.6 mmol/L — ABNORMAL HIGH (ref 20.0–28.0)
Drawn by: 7344
O2 Saturation: 75.2 %
Patient temperature: 37.6
pCO2, Ven: 107 mmHg (ref 44–60)
pH, Ven: 7.21 — ABNORMAL LOW (ref 7.25–7.43)
pO2, Ven: 44 mmHg (ref 32–45)

## 2024-02-23 LAB — GLUCOSE, CAPILLARY: Glucose-Capillary: 103 mg/dL — ABNORMAL HIGH (ref 70–99)

## 2024-02-23 LAB — MAGNESIUM: Magnesium: 1.7 mg/dL (ref 1.7–2.4)

## 2024-02-23 MED ORDER — DEXMEDETOMIDINE HCL IN NACL 400 MCG/100ML IV SOLN
0.0000 ug/kg/h | INTRAVENOUS | Status: DC
  Administered 2024-02-24: 0.4 ug/kg/h via INTRAVENOUS
  Administered 2024-02-24: 1.1 ug/kg/h via INTRAVENOUS
  Administered 2024-02-24: 1 ug/kg/h via INTRAVENOUS
  Administered 2024-02-24: 1.2 ug/kg/h via INTRAVENOUS
  Administered 2024-02-24: 0.8 ug/kg/h via INTRAVENOUS
  Administered 2024-02-24 (×2): 1.2 ug/kg/h via INTRAVENOUS
  Administered 2024-02-25 (×2): 0.6 ug/kg/h via INTRAVENOUS
  Administered 2024-02-25: 0.9 ug/kg/h via INTRAVENOUS
  Administered 2024-02-25 (×4): 1.2 ug/kg/h via INTRAVENOUS
  Administered 2024-02-25: 0.9 ug/kg/h via INTRAVENOUS
  Administered 2024-02-26: 1 ug/kg/h via INTRAVENOUS
  Administered 2024-02-26: 0.9 ug/kg/h via INTRAVENOUS
  Administered 2024-02-26: 0.7 ug/kg/h via INTRAVENOUS
  Administered 2024-02-26 (×2): 1 ug/kg/h via INTRAVENOUS
  Filled 2024-02-23: qty 200
  Filled 2024-02-23 (×8): qty 100
  Filled 2024-02-23: qty 300
  Filled 2024-02-23 (×8): qty 100

## 2024-02-23 MED ORDER — REVEFENACIN 175 MCG/3ML IN SOLN
175.0000 ug | Freq: Every day | RESPIRATORY_TRACT | Status: DC
  Administered 2024-02-24 – 2024-03-01 (×7): 175 ug via RESPIRATORY_TRACT
  Filled 2024-02-23 (×8): qty 3

## 2024-02-23 MED ORDER — FUROSEMIDE 10 MG/ML IJ SOLN
80.0000 mg | Freq: Two times a day (BID) | INTRAMUSCULAR | Status: AC
  Administered 2024-02-24 – 2024-02-25 (×3): 80 mg via INTRAVENOUS
  Filled 2024-02-23 (×3): qty 8

## 2024-02-23 MED ORDER — BUDESONIDE 0.25 MG/2ML IN SUSP
0.2500 mg | Freq: Two times a day (BID) | RESPIRATORY_TRACT | Status: DC
  Administered 2024-02-23 – 2024-03-01 (×14): 0.25 mg via RESPIRATORY_TRACT
  Filled 2024-02-23 (×16): qty 2

## 2024-02-23 MED ORDER — IOHEXOL 350 MG/ML SOLN
75.0000 mL | Freq: Once | INTRAVENOUS | Status: AC | PRN
  Administered 2024-02-23: 75 mL via INTRAVENOUS

## 2024-02-23 MED ORDER — DEXMEDETOMIDINE HCL IN NACL 400 MCG/100ML IV SOLN
INTRAVENOUS | Status: AC
  Administered 2024-02-23: 0.4 ug/kg/h via INTRAVENOUS
  Filled 2024-02-23: qty 100

## 2024-02-23 MED ORDER — IPRATROPIUM-ALBUTEROL 0.5-2.5 (3) MG/3ML IN SOLN
3.0000 mL | RESPIRATORY_TRACT | Status: DC | PRN
  Administered 2024-02-23: 3 mL via RESPIRATORY_TRACT
  Filled 2024-02-23: qty 3

## 2024-02-23 MED ORDER — IOHEXOL 350 MG/ML SOLN: 75.0000 mL | Freq: Once | INTRAVENOUS | Status: DC | PRN

## 2024-02-23 MED ORDER — ARFORMOTEROL TARTRATE 15 MCG/2ML IN NEBU
15.0000 ug | INHALATION_SOLUTION | Freq: Two times a day (BID) | RESPIRATORY_TRACT | Status: DC
  Administered 2024-02-23 – 2024-03-01 (×15): 15 ug via RESPIRATORY_TRACT
  Filled 2024-02-23 (×16): qty 2

## 2024-02-23 NOTE — Progress Notes (Signed)
 OT Cancellation Note  Patient Details Name: Stephanie Knight MRN: [id number] DOB: 09/29/1960   Cancelled Treatment:    Reason Eval/Treat Not Completed: Medical issues which prohibited therapy. Per RN pt remains on Bipap, awaiting ABGs. Requesting therapy to follow up in PM. Will follow up as able to.   Jaquarius Seder C, OT  Acute Rehabilitation Services Office [phone] Secure chat preferred   Adrianne GORMAN Savers 02/23/2024, 9:34 AM

## 2024-02-23 NOTE — Progress Notes (Signed)
 PT Cancellation Note  Patient Details Name: Stephanie Knight MRN: [id number] DOB: [birth year]   Cancelled Treatment:    Reason Eval/Treat Not Completed: Medical issues which prohibited therapy, remains on BiPAP and awaiting ABGs. RN request check back later today, will follow up as schedule permits.  Dyonna Jaspers, PT, DPT Acute Rehabilitation Services  Personal: Secure Chat Rehab Office: [phone]  Arrian Manson L Claude Waldman 02/23/2024, 9:33 AM

## 2024-02-23 NOTE — Progress Notes (Signed)
"  °  Progress Note  Patient Name: Stephanie Knight Date of Encounter: 02/23/2024 [facility] Cardiologist: None   Interval Summary   Denies any dyspnea this morning but continues to report LE edema. She really wants to drink/eat but has been on BiPAP  Vital Signs Vitals:   02/23/24 0246 02/23/24 0318 02/23/24 0714 02/23/24 0739  BP:  132/84 115/77   Pulse:  84 86 79  Resp: (!) 27 (!) 21 20 (!) 28  Temp:   98.6 F (37 C)   TempSrc:   Axillary   SpO2:  99% 98% 98%  Weight:      Height:        Intake/Output Summary (Last 24 hours) at 02/23/2024 0902 Last data filed at 02/23/2024 0718 Gross per 24 hour  Intake 120 ml  Output 3175 ml  Net -3055 ml      02/22/2024    6:16 PM 01/30/2022   11:22 AM 11/14/2021    6:41 AM  Last 3 Weights  Weight (lbs) 311 lb 291 lb 9.6 oz 300 lb  Weight (kg) 141.069 kg 132.269 kg 136.079 kg      Telemetry/ECG  NSR - Personally Reviewed  Physical Exam  GEN: No acute distress.   Neck: No JVD Cardiac: RRR, no murmurs, rubs, or gallops.  Respiratory: Clear to auscultation bilaterally. GI: Soft, nontender, non-distended  MS: 2+ LE edema  Assessment & Plan  Ms. Plouff is a 64 year old female with history of COPD, tobacco use, chronic bilateral lower extremity edema on Lasix , and obesity who presented with worsening lower extremity edema and dyspnea.  Labs remarkable for elevated BNP, and troponin at 59, and 61.  Additional lab studies with hypercarbia with pH 7.24 and pCO2 of 89.  EKG with evidence of right ventricular hypertrophy.  Chest x-ray with cardiomegaly and interstitial edema.  #Acute hypoxic/hypercapnic respiratory failure #COPD exacerbation #HFpEF exacerbation - Her presentation is likely in the setting of combined COPD and HFpEF exacerbation.  There was suspect that her dyspnea is more related to her COPD than HFpEF giving ABG with pCO2 of 89.  I agree with BiPAP and DuoNebs for COPD exacerbation.   - She has had good urine  output on IV Lasix  40 twice daily.  I will continue that for now. - Will follow-up with 2D echocardiogram and CTPE.  - We will continue to follow  For questions or updates, please contact [facility] Please consult [email or web address] for contact info under   Signed, Joelle VEAR Ren Donley, MD  "

## 2024-02-23 NOTE — Progress Notes (Signed)
 OT Cancellation Note  Patient Details Name: Stephanie Knight MRN: [id number] DOB: 1960-05-31   Cancelled Treatment:    Reason Eval/Treat Not Completed: Medical issues which prohibited therapy. Per RN, pt not tolerating [place] well, replaced on Bipap. Will hold therapy for the day.   Shanik Brookshire C, OT  Acute Rehabilitation Services Office [phone] Secure chat preferred   Adrianne GORMAN Savers 02/23/2024, 2:07 PM

## 2024-02-23 NOTE — Progress Notes (Signed)
 Pt somnolent, falling right to sleep in the middle of conversation, RR 40s, 88% on 3L, sitting up in the chair.  RN assisted pt back to bed. Placing pt back on bipap. MD and RT made aware.  Rt at the bedside.

## 2024-02-23 NOTE — Plan of Care (Signed)
   Problem: Health Behavior/Discharge Planning: Goal: Ability to manage health-related needs will improve Outcome: Progressing   Problem: Clinical Measurements: Goal: Ability to maintain clinical measurements within normal limits will improve Outcome: Progressing   Problem: Clinical Measurements: Goal: Will remain free from infection Outcome: Progressing

## 2024-02-23 NOTE — Consult Note (Signed)
 "  NAME:  Stephanie Knight, MRN:  [id number], DOB:  [birth year], LOS: 1 ADMISSION DATE:  02/22/2024, CONSULTATION DATE:  02/23/24 REFERRING MD:  Charlton DASEN., CHIEF COMPLAINT:  AECOPD on BiPAP/encephalopathy  History of Present Illness:  Stephanie Knight is a 64 yo female with past medical history significant for COPD, tobacco use, rectal bleeding who presented 02/22/24 from [facility] for BLE edema and respiratory distress. The patient was placed on BiPAP for hypercapnia with minimal improvement and admitted to [facility]. Labs notable for polycythemia, BNP 1,948, sCr 1.02. She has been afebrile and normotensive. Further workup included a CTA that was negative for PE, but showed right heart enlargement. Echo with preserved EF, but with RV enlargement and septal flattening; cardiology was consulted and patient was started on diuretics. PCCM consulted for ICU admission for persistent encephalopathy now requiring initiation of precedex  for BiPAP compliance.  Of note, patient was recently seen by her pulmonologist Dr. Theotis and was started on home O2 and lasix , however patient had yet to start treatment. At that visit, it was also noted that she had rectal bleeding and had recently completed an abx course for UTI.   Pertinent Medical History:   Past Medical History:  Diagnosis Date   Acute cor pulmonale without pulmonary embolism (HCC)    Anemia    with pregnancy    COPD (chronic obstructive pulmonary disease) (HCC)    PONV (postoperative nausea and vomiting)    Tobacco abuse    Significant Hospital Events: Including procedures, antibiotic start and stop dates in addition to other pertinent events   1/10: Admit [facility], AECOPD, HFpEF->BiPAP +diuresis 1/11: ICU admission; dex gtt for BiPAP compliance  Interim History / Subjective:  PCCM consulted for ICU admission  Objective    Blood pressure 115/75, pulse 99, temperature 99.8 F (37.7 C), temperature source Axillary, resp. rate (!) 53, height 5' 7 (1.702  m), weight (!) 146.4 kg, SpO2 94%.    Vent Mode: BIPAP;PCV FiO2 (%):  [40 %-50 %] 40 % Set Rate:  [12 bmp-24 bmp] 12 bmp PEEP:  [6 cmH20] 6 cmH20   Intake/Output Summary (Last 24 hours) at 02/23/2024 2218 Last data filed at 02/23/2024 2214 Gross per 24 hour  Intake 240 ml  Output 4800 ml  Net -4560 ml   Filed Weights   02/22/24 1816 02/23/24 0900  Weight: (!) 141.1 kg (!) 146.4 kg   Examination: General: chronically-ill woman agitated in bed on BiPAP HEENT: AT/[place] Pulm: labored respirations on BiPAP CV: ST, generalized edema GI: obese Neuro: disoriented; agitated pulling at BiPAP  Resolved Problem List:   Assessment and Plan:   Acute on chronic cor pulmonale with underlying COPD/OHS - (recently prescribed home O2) ADHF/HFpEF Acute hypercapnic encephalopathy -Cardiology following -CTA negative PE; showed right heart enlargement -Echo with EF 60%; RV enlargement and septal flattening supports RV overload physiology. -Marked volume 20lbs above dry weight; warrants aggressive diuresis -Received 120mg  Lasix  over 36 hours with good response; will increase to 80mg  Lasix  q12 x3 -Monitor uop -sCr 1.02->0.95; continue to monitor renal indices -Continue AECOPD pathway: LAMA/LABA/ICS -Initiate precedex  gtt for BiPAP compliance -Additional ABG if change in mental status -Follow cbc,bmp,mg,p -NPO while on BiPAP  ?Rectal bleeding -First reported to her pulmonologist Dr. Theotis on 02/18/24 -PPI bid -Hgb stable -Monitor labs  Secondary polycythemia in setting of COPD -Monitor  DVT ppx: Heparin  sq  Labs:  CBC: Recent Labs  Lab 02/22/24 1357 02/23/24 0135  WBC 8.5 8.3  NEUTROABS  --  5.5  HGB  14.6 14.1  HCT 48.9* 48.7*  MCV 80.4 82.8  PLT 329 297    Basic Metabolic Panel: Recent Labs  Lab 02/22/24 1357 02/22/24 1923 02/23/24 0135  NA 143  --  140  K 4.1  --  4.8  CL 102  --  99  CO2 34*  --  34*  GLUCOSE 94  --  97  BUN 23  --  19  CREATININE 1.02*  --   0.95  CALCIUM 8.5*  --  8.3*  MG  --  1.8 1.7  PHOS  --   --  4.8*   GFR: Estimated Creatinine Clearance: 87.8 mL/min (by C-G formula based on SCr of 0.95 mg/dL). Recent Labs  Lab 02/22/24 1357 02/23/24 0135  WBC 8.5 8.3    Liver Function Tests: Recent Labs  Lab 02/22/24 1923 02/23/24 0135  AST 46* 53*  ALT 46* 41  ALKPHOS 93 81  BILITOT 0.6 0.7  PROT 5.9* 5.6*  ALBUMIN  3.0* 2.9*   No results for input(s): LIPASE, AMYLASE in the last 168 hours. No results for input(s): AMMONIA in the last 168 hours.  ABG    Component Value Date/Time   PHART 7.24 (L) 02/22/2024 2255   PCO2ART 89 (HH) 02/22/2024 2255   PO2ART 64 (L) 02/22/2024 2255   HCO3 42.6 (H) 02/23/2024 1834   O2SAT 75.2 02/23/2024 1834     Coagulation Profile: No results for input(s): INR, PROTIME in the last 168 hours.  Cardiac Enzymes: No results for input(s): CKTOTAL, CKMB, CKMBINDEX, TROPONINI in the last 168 hours.  HbA1C: Hgb A1c MFr Bld  Date/Time Value Ref Range Status  04/08/2019 10:51 AM 5.5 4.8 - 5.6 % Final    Comment:             Prediabetes: 5.7 - 6.4          Diabetes: >6.4          Glycemic control for adults with diabetes: <7.0    CBG: No results for input(s): GLUCAP in the last 168 hours.  Review of Systems:   Unable to assess d/t agitation while on BiPAP  Past Medical History:  She,  has a past medical history of Acute cor pulmonale without pulmonary embolism (HCC), Anemia, COPD (chronic obstructive pulmonary disease) (HCC), PONV (postoperative nausea and vomiting), and Tobacco abuse.   Surgical History:   Past Surgical History:  Procedure Laterality Date   ORIF ANKLE FRACTURE Right 05/03/2016   Procedure: OPEN REDUCTION INTERNAL FIXATION RIGHT (ORIF) ANKLE FRACTURE WEIGHT BEARING DISTAL TIBIA WITH FIXATION TIBIA AND FIBULA;  Surgeon: Evalene JONETTA Chancy, MD;  Location: [facility];  Service: Orthopedics;  Laterality: Right;   Right blocked tear  duct       Social History:   reports that she has been smoking cigarettes. She has a 22 pack-year smoking history. She has never used smokeless tobacco. She reports current alcohol use. She reports that she does not currently use drugs after having used the following drugs: Marijuana.   Family History:  Her family history includes Dementia in her mother; Hypertension in her father, mother, and sister. There is no history of Colon cancer, Colon polyps, Esophageal cancer, Rectal cancer, or Stomach cancer.   Allergies Allergies[1]   Home Medications  Prior to Admission medications  Medication Sig Start Date End Date Taking? Authorizing Provider  albuterol  (VENTOLIN  HFA) 108 (90 Base) MCG/ACT inhaler Inhale 2 puffs into the lungs every 6 (six) hours as needed for wheezing or shortness  of breath. Patient not taking: Reported on 02/23/2024 02/18/24   [provider]  budesonide -glycopyrrolate -formoterol  (BREZTRI  AEROSPHERE) 160-9-4.8 MCG/ACT AERO inhaler Inhale 2 puffs into the lungs daily. Patient not taking: Reported on 02/23/2024 12/05/23 12/04/24  [provider]  ciprofloxacin (CIPRO) 500 MG tablet Take 500 mg by mouth 2 (two) times daily. for 7 days Patient not taking: Reported on 02/23/2024 02/18/24 02/25/24  [provider]  Fluticasone-Umeclidin-Vilant (TRELEGY ELLIPTA) 100-62.5-25 MCG/ACT AEPB Inhale 1 puff into the lungs daily. Patient not taking: Reported on 02/23/2024 10/09/22   [provider]  furosemide  (LASIX ) 40 MG tablet Take 40 mg by mouth daily. Patient not taking: Reported on 02/23/2024 07/31/23   [provider]  HYDROcodone -acetaminophen  (NORCO/VICODIN) 5-325 MG tablet Take 1 tablet by mouth every 6 (six) hours as needed. Patient not taking: Reported on 02/23/2024 11/19/23   [provider]  hydrOXYzine (ATARAX) 25 MG tablet Take 25 mg by mouth 3 (three) times daily as needed. Patient not taking: Reported on 02/23/2024    [provider]  meclizine  (ANTIVERT ) 12.5 MG tablet Take 1 tablet (12.5 mg total) by mouth 3 (three) times daily as needed for dizziness. Patient not taking: Reported on 02/23/2024 01/30/22   Rush Nest, MD  Naproxen Sodium (ALEVE) 220 MG CAPS Take 2 capsules by mouth daily as needed (pain). Patient not taking: Reported on 01/30/2022    [provider]  tirzepatide (ZEPBOUND) 2.5 MG/0.5ML Pen Inject 2.5 mg into the skin once a week. Patient not taking: Reported on 02/23/2024 07/16/23   [provider]    Warren Shade, DNP, AGACNP-BC [facility]  Please see [email or web address] for pager details.  From 7A-7P if no response, please call [phone]. After hours, please call ELink [phone].    [1]  Allergies Allergen Reactions   Aspirin  Nausea And Vomiting   "

## 2024-02-23 NOTE — Progress Notes (Signed)
 RT note. RT at patient bedside due to bipap being taken off by RN for mouth care. Patient sat 85% on [place]. Patient placed back on bipap at this time due to desat. Patient now sat 97% on bipap. Patient did state she did not want to wear bipap at this time and wanted to eat. Was able to persuade patient to place back on. RT will continue to monitor.

## 2024-02-23 NOTE — Progress Notes (Signed)
 Patient was seen for hypercarbia and AMS.   She has hx of COPD, presented with leg swelling and SOB, and was admitted 1/10 for suspected acute CHF. She is being diuresed and cardiology is following.   She has had VBG with pH 7.23 and pCO2 106 last night and was started on BiPAP.   Despite BiPAP, pH and pCO2 have not improved. Patient has been lethargic and disoriented, but continuously trying to remove the mask.   Discussed the situation with Critical Care who came to the bedside to evaluate her. Planning to start Precedex  and continue BiPAP with close monitoring.

## 2024-02-23 NOTE — Progress Notes (Signed)
 " PROGRESS NOTE    Stephanie Knight  FMW:[id number] DOB: Jan 12, 1961 DOA: 02/22/2024 PCP: [facility]   Brief Narrative:  Stephanie Knight is a 64 y.o. female with past medical history  of severe COPD, tobacco abuse, rectal bleeding sent from [facility] for lower extremity edema and respiratory distress/hypoxia.  Assessment & Plan:   Principal Problem:   Acute respiratory failure with hypoxia (HCC) Active Problems:   Acute heart failure with preserved ejection fraction (HFpEF) (HCC)   Acute hypoxic hypercarbic respiratory failure -multifactorial COPD exacerbation Volume overload, rule out heart failure/pulmonary hypertension exacerbation Rule out obesity hypoventilation syndrome/sleep apnea (morbid obesity) Rule out acute PE - Severe COPD noted at baseline, noncompliant with inhalers, follow-up and continues to smoke - Questionable heart failure exacerbation, echo pending -appreciate cardiology insight and recommendations  -CTA pending given elevated D-dimer - Continue BiPAP and nasal cannula for symptom management -ABG confirms hypercarbia hypoxia - Complicated by body habitus likely some component of OHS/OSA **Echo notable for EF 60 to 65% with normal diastolic function but elevated RVSP and overload Acute metabolic encephalopathy  - Likely secondary to hypercarbia as above, improved with BiPAP, continue to follow clinically - Patient follows outpatient pulmonology, reports being set up with home oxygen but has apparently not followed up to have it delivered  Medication and follow-up noncompliance - Lengthy discussion in regards to compliance moving forward with medications as well as cessation of tobacco  Morbid obesity Body mass index is 50.55 kg/m. - Complicating general health and comorbid conditions given ongoing respiratory distress hypoxia and hypercarbia  DVT prophylaxis: heparin  injection 5,000 Units Start: 02/22/24 2200 Code Status:   Code Status: Full  Code Family Communication: None present  Status is: Inpatient  Dispo: The patient is from: Home              Anticipated d/c is to: To be determined              Anticipated d/c date is: to be determined              Patient currently not medically stable for discharge  Consultants:  Cardiology  Procedures:  None  Antimicrobials:  None indicated  Subjective: No acute issues or events overnight, tolerating BiPAP well denies nausea vomiting diarrhea constipation headache fevers chills chest pain.  Shortness of breath improving but not yet back to baseline  Objective: Vitals:   02/23/24 0246 02/23/24 0318 02/23/24 0714 02/23/24 0739  BP:  132/84 115/77   Pulse:  84 86 79  Resp: (!) 27 (!) 21 20 (!) 28  Temp:   98.6 F (37 C)   TempSrc:   Axillary   SpO2:  99% 98% 98%  Weight:      Height:        Intake/Output Summary (Last 24 hours) at 02/23/2024 0754 Last data filed at 02/23/2024 0718 Gross per 24 hour  Intake 120 ml  Output 3175 ml  Net -3055 ml   Filed Weights   02/22/24 1816  Weight: (!) 141.1 kg    Examination:  General:  Pleasantly resting in bed, No acute distress. HEENT:  Normocephalic atraumatic.  Sclerae nonicteric, noninjected.  Extraocular movements intact bilaterally. Neck:  Without mass or deformity.  Trachea is midline. Lungs:  Clear to auscultate bilaterally without rhonchi, wheeze, or rales. Heart:  Regular rate and rhythm.  Without murmurs, rubs, or gallops. Abdomen:  Soft, nontender, nondistended.  Without guarding or rebound. Extremities: Without cyanosis, clubbing, edema, or obvious deformity. Skin:  Warm and dry, no erythema.  Data Reviewed: I have personally reviewed following labs and imaging studies  CBC: Recent Labs  Lab 02/22/24 1357 02/23/24 0135  WBC 8.5 8.3  NEUTROABS  --  5.5  HGB 14.6 14.1  HCT 48.9* 48.7*  MCV 80.4 82.8  PLT 329 297   Basic Metabolic Panel: Recent Labs  Lab 02/22/24 1357 02/22/24 1923  02/23/24 0135  NA 143  --  140  K 4.1  --  4.8  CL 102  --  99  CO2 34*  --  34*  GLUCOSE 94  --  97  BUN 23  --  19  CREATININE 1.02*  --  0.95  CALCIUM 8.5*  --  8.3*  MG  --  1.8 1.7  PHOS  --   --  4.8*   GFR: Estimated Creatinine Clearance: 85.9 mL/min (by C-G formula based on SCr of 0.95 mg/dL). Liver Function Tests: Recent Labs  Lab 02/22/24 1923 02/23/24 0135  AST 46* 53*  ALT 46* 41  ALKPHOS 93 81  BILITOT 0.6 0.7  PROT 5.9* 5.6*  ALBUMIN  3.0* 2.9*   No results for input(s): LIPASE, AMYLASE in the last 168 hours. No results for input(s): AMMONIA in the last 168 hours. Coagulation Profile: No results for input(s): INR, PROTIME in the last 168 hours. Cardiac Enzymes: No results for input(s): CKTOTAL, CKMB, CKMBINDEX, TROPONINI in the last 168 hours. BNP (last 3 results) Recent Labs    02/22/24 1357  PROBNP 1,948.0*   HbA1C: No results for input(s): HGBA1C in the last 72 hours. CBG: No results for input(s): GLUCAP in the last 168 hours. Lipid Profile: No results for input(s): CHOL, HDL, LDLCALC, TRIG, CHOLHDL, LDLDIRECT in the last 72 hours. Thyroid Function Tests: Recent Labs    02/22/24 1923  TSH 1.300  FREET4 1.06   Anemia Panel: No results for input(s): VITAMINB12, FOLATE, FERRITIN, TIBC, IRON, RETICCTPCT in the last 72 hours. Sepsis Labs: No results for input(s): PROCALCITON, LATICACIDVEN in the last 168 hours.  Recent Results (from the past 240 hours)  MRSA Next Gen by PCR, Nasal     Status: None   Collection Time: 02/22/24  6:18 PM   Specimen: Nasal Mucosa; Nasal Swab  Result Value Ref Range Status   MRSA by PCR Next Gen NOT DETECTED NOT DETECTED Final    Comment: (NOTE) The GeneXpert MRSA Assay (FDA approved for NASAL specimens only), is one component of a comprehensive MRSA colonization surveillance program. It is not intended to diagnose MRSA infection nor to guide or monitor treatment  for MRSA infections. Test performance is not FDA approved in patients less than 81 years old. Performed at [facility], [address], [place], [place] [address]          Radiology Studies: DG Chest 2 View Result Date: 02/22/2024 EXAM: 2 VIEW(S) XRAY OF THE CHEST 02/22/2024 01:52:24 PM COMPARISON: 07/16/2023 from [facility]. CLINICAL HISTORY: Shortness of breath. FINDINGS: LINES, TUBES AND DEVICES: Multiple wires and leads project over the chest on the frontal radiograph. LUNGS AND PLEURA: Interstitial and airspace disease is greater right than left and lower lung predominant. No pleural effusion. No pneumothorax. HEART AND MEDIASTINUM: Moderate cardiomegaly. BONES AND SOFT TISSUES: No acute osseous abnormality. LIMITATIONS/ARTIFACTS: The lateral view is moderately degraded by patient size and arm position. Patient rotated left on the frontal. IMPRESSION: 1. Cardiomegaly with moderate, asymmetric right congestive heart failure. Electronically signed by: Rockey Kilts MD MD 02/22/2024 02:15 PM EST  RP Workstation: [id number]        Scheduled Meds:  budesonide -glycopyrrolate -formoterol   2 puff Inhalation BID   furosemide   40 mg Intravenous Q12H   heparin   5,000 Units Subcutaneous Q8H   nicotine   14 mg Transdermal Daily   pantoprazole  (PROTONIX ) IV  40 mg Intravenous Q12H   sodium chloride  flush  3 mL Intravenous Q12H   Continuous Infusions:   LOS: 1 day   Time spent:  Elsie JAYSON Montclair, DO [facility]  If 7PM-7AM, please contact night-coverage [email or web address]  02/23/2024, 7:54 AM      "

## 2024-02-23 NOTE — Progress Notes (Signed)
 Patient transported to CT and returned to 2C10. Patient tolerated well. RT will continue to monitor.

## 2024-02-23 NOTE — Progress Notes (Signed)
 MD at the bedside. Want us  to wean pt off Bipap. Placed pt on [place] 4L. Pt sats 97%.

## 2024-02-23 NOTE — Evaluation (Signed)
 RT Evaluate and Treat Note  02/23/2024   Breathing is (select one): Worse than normal   The following was found on auscultation (select multiple):  Bilateral Breath Sounds: Diminished;Expiratory wheezes (02/23/24 1634)  R Upper  Breath Sounds: Diminished (02/23/24 1634) L Upper Breath Sounds: Expiratory wheezes;Diminished (02/23/24 1634) R Lower Breath Sounds: Diminished (02/23/24 1634) L Lower Breath Sounds: Diminished (02/23/24 1634)    Cough Assessment: Cough: Productive (02/22/24 1312)    Most Recent Chest Xray:... (No results found.    The following medications and/or interventions were ordered/changed/discontinued as part of the Respiratory Treatment protocol:   Medication Changes: Duoneb Ordered    Airway Clearance Changes: No Change   Oxygen Therapy Changes:No Change

## 2024-02-23 NOTE — Progress Notes (Signed)
 Pt transported from 2C10 to 3M04 via Servo BiPAP w/o complication.

## 2024-02-23 NOTE — Plan of Care (Signed)
" °  Problem: Education: Goal: Knowledge of General Education information will improve Description: Including pain rating scale, medication(s)/side effects and non-pharmacologic comfort measures Outcome: Not Progressing   Problem: Health Behavior/Discharge Planning: Goal: Ability to manage health-related needs will improve Outcome: Not Progressing   Problem: Clinical Measurements: Goal: Ability to maintain clinical measurements within normal limits will improve Outcome: Progressing Goal: Will remain free from infection Outcome: Progressing Goal: Diagnostic test results will improve Outcome: Progressing Goal: Respiratory complications will improve Outcome: Not Progressing Goal: Cardiovascular complication will be avoided Outcome: Progressing   Problem: Activity: Goal: Risk for activity intolerance will decrease Outcome: Not Progressing   Problem: Nutrition: Goal: Adequate nutrition will be maintained Outcome: Progressing   Problem: Elimination: Goal: Will not experience complications related to bowel motility Outcome: Progressing Goal: Will not experience complications related to urinary retention Outcome: Progressing   Problem: Pain Managment: Goal: General experience of comfort will improve and/or be controlled Outcome: Progressing   Problem: Safety: Goal: Ability to remain free from injury will improve Outcome: Progressing   Problem: Skin Integrity: Goal: Risk for impaired skin integrity will decrease Outcome: Progressing   "

## 2024-02-24 ENCOUNTER — Inpatient Hospital Stay (HOSPITAL_COMMUNITY)

## 2024-02-24 DIAGNOSIS — I2729 Other secondary pulmonary hypertension: Secondary | ICD-10-CM

## 2024-02-24 LAB — POCT I-STAT 7, (LYTES, BLD GAS, ICA,H+H)
Acid-Base Excess: 13 mmol/L — ABNORMAL HIGH (ref 0.0–2.0)
Bicarbonate: 42.9 mmol/L — ABNORMAL HIGH (ref 20.0–28.0)
Calcium, Ion: 1.17 mmol/L (ref 1.15–1.40)
HCT: 46 % (ref 36.0–46.0)
Hemoglobin: 15.6 g/dL — ABNORMAL HIGH (ref 12.0–15.0)
O2 Saturation: 93 %
Patient temperature: 99.3
Potassium: 4.1 mmol/L (ref 3.5–5.1)
Sodium: 138 mmol/L (ref 135–145)
TCO2: 45 mmol/L — ABNORMAL HIGH (ref 22–32)
pCO2 arterial: 82 mmHg (ref 32–48)
pH, Arterial: 7.329 — ABNORMAL LOW (ref 7.35–7.45)
pO2, Arterial: 80 mmHg — ABNORMAL LOW (ref 83–108)

## 2024-02-24 LAB — GLUCOSE, CAPILLARY
Glucose-Capillary: 95 mg/dL (ref 70–99)
Glucose-Capillary: 110 mg/dL — ABNORMAL HIGH (ref 70–99)
Glucose-Capillary: 128 mg/dL — ABNORMAL HIGH (ref 70–99)
Glucose-Capillary: 152 mg/dL — ABNORMAL HIGH (ref 70–99)
Glucose-Capillary: 143 mg/dL — ABNORMAL HIGH (ref 70–99)

## 2024-02-24 MED ORDER — OLANZAPINE 10 MG IM SOLR
5.0000 mg | Freq: Four times a day (QID) | INTRAMUSCULAR | Status: AC | PRN
  Administered 2024-02-24 – 2024-02-28 (×3): 5 mg via INTRAVENOUS
  Filled 2024-02-24 (×6): qty 10

## 2024-02-24 MED ORDER — MAGNESIUM SULFATE 2 GM/50ML IV SOLN
2.0000 g | Freq: Once | INTRAVENOUS | Status: AC
  Administered 2024-02-24: 2 g via INTRAVENOUS
  Filled 2024-02-24: qty 50

## 2024-02-24 MED ORDER — METHYLPREDNISOLONE SODIUM SUCC 125 MG IJ SOLR
125.0000 mg | Freq: Once | INTRAMUSCULAR | Status: AC
  Administered 2024-02-24: 125 mg via INTRAVENOUS
  Filled 2024-02-24: qty 2

## 2024-02-24 MED ORDER — PANTOPRAZOLE SODIUM 40 MG IV SOLR
40.0000 mg | INTRAVENOUS | Status: DC
  Administered 2024-02-24 – 2024-02-26 (×3): 40 mg via INTRAVENOUS
  Filled 2024-02-24 (×4): qty 10

## 2024-02-24 MED ORDER — STERILE WATER FOR INJECTION IJ SOLN
INTRAMUSCULAR | Status: AC
  Filled 2024-02-24: qty 10

## 2024-02-24 MED ORDER — SODIUM CHLORIDE 0.9 % IV SOLN
2.0000 g | INTRAVENOUS | Status: AC
  Administered 2024-02-24 – 2024-02-28 (×5): 2 g via INTRAVENOUS
  Filled 2024-02-24 (×5): qty 20

## 2024-02-24 MED ORDER — SODIUM CHLORIDE 0.9 % IV SOLN
100.0000 mg | Freq: Two times a day (BID) | INTRAVENOUS | Status: DC
  Administered 2024-02-24 – 2024-02-26 (×6): 100 mg via INTRAVENOUS
  Filled 2024-02-24 (×7): qty 100

## 2024-02-24 NOTE — Progress Notes (Signed)
 PT placed on BIPAP at this time.   02/24/24 2119  BiPAP/CPAP/SIPAP  BiPAP/CPAP/SIPAP Pt Type Adult  BiPAP/CPAP/SIPAP SERVO  Mask Type Full face mask  Dentures removed? Not applicable  Mask Size Large  Set Rate 18 breaths/min  Respiratory Rate 23 breaths/min  IPAP 27 cmH20  EPAP 5 cmH2O  PEEP 5 cmH20  FiO2 (%) 40 %  Minute Ventilation 18.5  Leak 13  Peak Inspiratory Pressure (PIP) 23  Tidal Volume (Vt) 703  Patient Home Machine No  Patient Home Mask No  Patient Home Tubing No  Auto Titrate No  Press High Alarm 40 cmH2O  Nasal massage performed Yes  CPAP/SIPAP surface wiped down Yes  Device Plugged into RED Power Outlet Yes

## 2024-02-24 NOTE — Progress Notes (Signed)
 Heart Failure Navigator Progress Note  Assessed for Heart & Vascular TOC clinic readiness.  Patient does not meet criteria due to she is a patient with [facility]. No HF TOC. .   Navigator will sign off at this time.   Stephane Haddock, BSN, Scientist, Clinical (histocompatibility And Immunogenetics) Only

## 2024-02-24 NOTE — Progress Notes (Signed)
 "  NAME:  Stephanie Knight, MRN:  [id number], DOB:  [birth year], LOS: 2 ADMISSION DATE:  02/22/2024, CONSULTATION DATE:  02/23/2024 REFERRING MD:  Charlton DASEN., CHIEF COMPLAINT:  AECOPD on BiPAP/encephalopathy   History of Present Illness:  Stephanie Knight is a 64 yo female with past medical history significant for COPD, tobacco use, rectal bleeding who presented 02/22/24 from [facility] for BLE edema and respiratory distress. The patient was placed on BiPAP for hypercapnia with minimal improvement and admitted to [facility]. Labs notable for polycythemia, BNP 1,948, sCr 1.02. She has been afebrile and normotensive. Further workup included a CTA that was negative for PE, but showed right heart enlargement. Echo with preserved EF, but with RV enlargement and septal flattening; cardiology was consulted and patient was started on diuretics. PCCM consulted for ICU admission for persistent encephalopathy now requiring initiation of precedex  for BiPAP compliance.   Of note, patient was recently seen by her pulmonologist Dr. Theotis and was started on home O2 and lasix , however patient had yet to start treatment. At that visit, it was also noted that she had rectal bleeding and had recently completed an abx course for UTI.   Pertinent  Medical History       Past Medical History:  Diagnosis Date   Acute cor pulmonale without pulmonary embolism (HCC)     Anemia      with pregnancy    COPD (chronic obstructive pulmonary disease) (HCC)     PONV (postoperative nausea and vomiting)     Tobacco abuse     Significant Hospital Events: Including procedures, antibiotic start and stop dates in addition to other pertinent events   1/10: Admit [facility], AECOPD, HFpEF->BiPAP +diuresis 1/11: ICU admission; dex gtt for BiPAP compliance  Interim History / Subjective:  Repeat CXR this a.m., slightly better, but still congested Will repeat ABG, still somnolent Decent UOP, 2 L overnight, continuing diuresis  Objective    Blood  pressure 102/66, pulse 60, temperature 100 F (37.8 C), temperature source Axillary, resp. rate 15, height 5' 7 (1.702 m), weight 136 kg, SpO2 100%.    Vent Mode: BIPAP;PCV FiO2 (%):  [40 %] 40 % Set Rate:  [12 bmp] 12 bmp PEEP:  [6 cmH20] 6 cmH20   Intake/Output Summary (Last 24 hours) at 02/24/2024 0736 Last data filed at 02/24/2024 0600 Gross per 24 hour  Intake 344.53 ml  Output 3700 ml  Net -3355.47 ml   Filed Weights   02/22/24 1816 02/23/24 0900 02/24/24 0700  Weight: (!) 141.1 kg (!) 146.4 kg 136 kg    Examination: General: Morbid obese, lying in bed, in mild distress HENT: NCAT Lungs: Symmetrical chest wall movement, mild wheezes bilaterally Cardiovascular: S1-S2 normal, no murmur Abdomen: Soft and nontender, distended abdomen Extremities: 2+ pitting edema Neuro: Somnolent, not following commands, GU: Deferred  Resolved problem list   Assessment and Plan   64 year old female with history of COPD (recently prescribed home oxygen), tobacco use, rectal bleeding who presents with SOB due to acute on chronic cor pulmonale   Acute hypoxic hypercapnic respiratory failure-on BiPAP Acute on chronic cor pulmonale COPD OHS HFpEF  echo 02/23/2024: EF 60-65%, no regional wall motion abnormalities, & ventricular septum flattened in systolic and diastolic consistent with RV pressure and volume overload.  Normal RV systolic function.  Moderately increased RV size & wall thickness.  Dilated IVC with less than 50% CTA: Negative for PE, right heart enlargement Dry weight in the past about 20lbs less than now    Plan -  Wean off Precedex  - Wean off BiPAP as tolerated, ABGs - Will likely need qhs BiPAP, and with naps - Continue diuresis 80 twice daily - Will also treat as COPD exacerbation - Solu-Medrol , ceftriaxone , Doxy(Azithromycin not available) - Continue breathing treatment(LAMA/LABA/ICS) - Monitor electrolytes and replace - Strict I&O's   Son updated at the  bedside   Labs   CBC: Recent Labs  Lab 02/22/24 1357 02/23/24 0135  WBC 8.5 8.3  NEUTROABS  --  5.5  HGB 14.6 14.1  HCT 48.9* 48.7*  MCV 80.4 82.8  PLT 329 297    Basic Metabolic Panel: Recent Labs  Lab 02/22/24 1357 02/22/24 1923 02/23/24 0135  NA 143  --  140  K 4.1  --  4.8  CL 102  --  99  CO2 34*  --  34*  GLUCOSE 94  --  97  BUN 23  --  19  CREATININE 1.02*  --  0.95  CALCIUM 8.5*  --  8.3*  MG  --  1.8 1.7  PHOS  --   --  4.8*   GFR: Estimated Creatinine Clearance: 84.1 mL/min (by C-G formula based on SCr of 0.95 mg/dL). Recent Labs  Lab 02/22/24 1357 02/23/24 0135  WBC 8.5 8.3    Liver Function Tests: Recent Labs  Lab 02/22/24 1923 02/23/24 0135  AST 46* 53*  ALT 46* 41  ALKPHOS 93 81  BILITOT 0.6 0.7  PROT 5.9* 5.6*  ALBUMIN  3.0* 2.9*   No results for input(s): LIPASE, AMYLASE in the last 168 hours. No results for input(s): AMMONIA in the last 168 hours.  ABG    Component Value Date/Time   PHART 7.24 (L) 02/22/2024 2255   PCO2ART 89 (HH) 02/22/2024 2255   PO2ART 64 (L) 02/22/2024 2255   HCO3 42.6 (H) 02/23/2024 1834   O2SAT 75.2 02/23/2024 1834     Coagulation Profile: No results for input(s): INR, PROTIME in the last 168 hours.  Cardiac Enzymes: No results for input(s): CKTOTAL, CKMB, CKMBINDEX, TROPONINI in the last 168 hours.  HbA1C: Hgb A1c MFr Bld  Date/Time Value Ref Range Status  04/08/2019 10:51 AM 5.5 4.8 - 5.6 % Final    Comment:             Prediabetes: 5.7 - 6.4          Diabetes: >6.4          Glycemic control for adults with diabetes: <7.0     CBG: Recent Labs  Lab 02/23/24 2316 02/24/24 0319  GLUCAP 103* 95    Review of Systems:   Negative except above  Past Medical History:  She,  has a past medical history of Acute cor pulmonale without pulmonary embolism (HCC), Anemia, COPD (chronic obstructive pulmonary disease) (HCC), PONV (postoperative nausea and vomiting), and Tobacco  abuse.   Surgical History:   Past Surgical History:  Procedure Laterality Date   ORIF ANKLE FRACTURE Right 05/03/2016   Procedure: OPEN REDUCTION INTERNAL FIXATION RIGHT (ORIF) ANKLE FRACTURE WEIGHT BEARING DISTAL TIBIA WITH FIXATION TIBIA AND FIBULA;  Surgeon: Evalene JONETTA Chancy, MD;  Location: [facility];  Service: Orthopedics;  Laterality: Right;   Right blocked tear duct       Social History:   reports that she has been smoking cigarettes. She has a 22 pack-year smoking history. She has never used smokeless tobacco. She reports current alcohol use. She reports that she does not currently use drugs after having used the following drugs: Marijuana.  Family History:  Her family history includes Dementia in her mother; Hypertension in her father, mother, and sister. There is no history of Colon cancer, Colon polyps, Esophageal cancer, Rectal cancer, or Stomach cancer.   Allergies Allergies[1]   Home Medications  Prior to Admission medications  Medication Sig Start Date End Date Taking? Authorizing Provider  albuterol  (VENTOLIN  HFA) 108 (90 Base) MCG/ACT inhaler Inhale 2 puffs into the lungs every 6 (six) hours as needed for wheezing or shortness of breath. Patient not taking: Reported on 02/23/2024 02/18/24   [provider]  budesonide -glycopyrrolate -formoterol  (BREZTRI  AEROSPHERE) 160-9-4.8 MCG/ACT AERO inhaler Inhale 2 puffs into the lungs daily. Patient not taking: Reported on 02/23/2024 12/05/23 12/04/24  [provider]  ciprofloxacin (CIPRO) 500 MG tablet Take 500 mg by mouth 2 (two) times daily. for 7 days Patient not taking: Reported on 02/23/2024 02/18/24 02/25/24  [provider]  Fluticasone-Umeclidin-Vilant (TRELEGY ELLIPTA) 100-62.5-25 MCG/ACT AEPB Inhale 1 puff into the lungs daily. Patient not taking: Reported on 02/23/2024 10/09/22   [provider]  furosemide  (LASIX ) 40 MG tablet Take 40 mg by mouth daily. Patient not taking:  Reported on 02/23/2024 07/31/23   [provider]  HYDROcodone -acetaminophen  (NORCO/VICODIN) 5-325 MG tablet Take 1 tablet by mouth every 6 (six) hours as needed. Patient not taking: Reported on 02/23/2024 11/19/23   [provider]  hydrOXYzine (ATARAX) 25 MG tablet Take 25 mg by mouth 3 (three) times daily as needed. Patient not taking: Reported on 02/23/2024    [provider]  meclizine  (ANTIVERT ) 12.5 MG tablet Take 1 tablet (12.5 mg total) by mouth 3 (three) times daily as needed for dizziness. Patient not taking: Reported on 02/23/2024 01/30/22   Rush Nest, MD  Naproxen Sodium (ALEVE) 220 MG CAPS Take 2 capsules by mouth daily as needed (pain). Patient not taking: Reported on 01/30/2022    [provider]  tirzepatide (ZEPBOUND) 2.5 MG/0.5ML Pen Inject 2.5 mg into the skin once a week. Patient not taking: Reported on 02/23/2024 07/16/23   [provider]           Lenny Drought, MD  [facility] Prefer epic messenger for cross cover needs   My critical care time: 30 minutes  Critical care time was exclusive of separately billable procedures and treating other patients.  Critical care was necessary to treat or prevent imminent or life-threatening deterioration.  Critical care was time spent personally by me on the following activities: development of treatment plan with patient and/or surrogate as well as nursing, discussions with consultants, evaluation of patient's response to treatment, examination of patient, obtaining history from patient or surrogate, ordering and performing treatments and interventions, ordering and review of laboratory studies, ordering and review of radiographic studies, pulse oximetry, re-evaluation of patient's condition and participation in multidisciplinary rounds.              [1]  Allergies Allergen Reactions   Aspirin  Nausea And Vomiting   "

## 2024-02-24 NOTE — Progress Notes (Signed)
 eLink Physician-Brief Progress Note Patient Name: Stephanie Knight DOB: October 11, 1960 MRN: [id number]   Date of Service  02/24/2024  HPI/Events of Note  Swinging at staff, agitated, ripped BIPAP off, she wants some water , but cant get any. Placed patient back on 3L [place].   Also requesting bilateral wrist restraints.   eICU Interventions  Continue precedex , add zyprexa  PRN  Restraints contraindicated with Bipap, okay to use with [place]     Intervention Category Minor Interventions: Agitation / anxiety - evaluation and management  [name] 02/24/2024, 9:51 PM

## 2024-02-24 NOTE — Plan of Care (Signed)

## 2024-02-24 NOTE — Progress Notes (Signed)
 RT was notified that PT was taking off BIPAP. Very agitated and refusing to wear it. BIPAP left at bedside, PT is now on 3L [place]. RT will continue to monitor.

## 2024-02-24 NOTE — Progress Notes (Signed)
 eLink Physician-Brief Progress Note Patient Name: Stephanie Knight DOB: May 29, 1960 MRN: [id number]   Date of Service  02/24/2024  HPI/Events of Note  64 yo female with past medical history significant for COPD, tobacco use, rectal bleeding who presented 02/22/24 from [facility] for BLE edema and respiratory distress.  Admitted to the acute cor pulmonale with heart failure exacerbation and potentially some rectal bleeding.  Vitals, labs, and imaging reviewed with evidence of persistent acute on chronic hypercapnic respiratory failure, and CT findings consistent with RV dysfunction as seen on echocardiogram.  eICU Interventions  Maintain BiPAP support, utilize Precedex  as needed for compliance  Diuresis with furosemide  as ordered  Transition to n.p.o.  DVT prophylaxis with heparin  GI prophylaxis with therapeutic pantoprazole      Intervention Category Evaluation Type: New Patient Evaluation  Kelley Knoth 02/24/2024, 12:34 AM

## 2024-02-24 NOTE — TOC CM/SW Note (Addendum)
 Transition of Care [facility]) - Inpatient Brief Assessment   Patient Details  Name: Stephanie Knight MRN: [id number] Date of Birth: 01-30-1961  [facility]) CM/SW Contact:    Tom-Johnson, Harvest Muskrat, RN Phone Number: 02/24/2024, 1:06 PM   Clinical Narrative:  Patient presented to the ED with Lower Extremity Edema and increased Shortness Of Breath. Admitted with acute Respiratory Failure with Hypoxia. Patient has hx of COPD, Rectal Bleed, Tobacco use. Chest X-ray showed Cardiomegaly with moderate asymmetric Rt Congestive Heart Failure. Patient is currently on BIPAP, on IV abx, Neb tx, IV Lasix  and Precedex . Does not use home O2.  CM spoke with patient's daughter, Delon at bedside about needs for post hospital transition. Patient's son, Karlene lives with her, patient has two children and a supportive sister. Retired, independent with care and drive self prior to admit. Does not have DME's at home. Patient does not have Home Health history, Delon states patient has been to outpatient PT before.  Delon states patient has a PCP at [facility] on [address] but could not remember name. CM called [facility] and was told patient has not been seen since 2023 therefore needs a new patient appointment at discharge. [place] notified. Patient uses Psychologist, [facility] on [address].   No ICM needs or recommendations noted at this time.  Patient not Medically ready for discharge.  CM will continue to follow as patient progresses with care towards discharge.           Transition of Care Asessment:

## 2024-02-24 NOTE — Progress Notes (Signed)
 Pt combative at this time and not tolerating mask at this time. ABG/HHN on hold for now.Placed on 3L [place] at this time

## 2024-02-24 NOTE — Progress Notes (Signed)
"  °  Progress Note  Patient Name: Stephanie Knight Date of Encounter: 02/24/2024 [facility] Cardiologist: None   Interval Summary    Laying in bed, remains confused. Was not tolerating Bipap mask, now on 3L [place]. Family at the bedside.   Vital Signs Vitals:   02/24/24 0600 02/24/24 0700 02/24/24 0800 02/24/24 0808  BP: 109/71 102/66 106/68   Pulse: 64 60 (!) 58   Resp:  15 13   Temp:    99.3 F (37.4 C)  TempSrc:    Axillary  SpO2: 97% 100% 100%   Weight:  136 kg    Height:        Intake/Output Summary (Last 24 hours) at 02/24/2024 0914 Last data filed at 02/24/2024 0800 Gross per 24 hour  Intake 385.89 ml  Output 3700 ml  Net -3314.11 ml      02/24/2024    7:00 AM 02/23/2024    9:00 AM 02/22/2024    6:16 PM  Last 3 Weights  Weight (lbs) 299 lb 13.2 oz 322 lb 12.1 oz 311 lb  Weight (kg) 136 kg 146.4 kg 141.069 kg      Telemetry/ECG   Sinus Rhythm with intermittent episodes of tachycardia - Personally Reviewed  Physical Exam  GEN: No acute distress, on [place]. In mitten restraints  Neck: No JVD Cardiac: RRR, no murmurs, rubs, or gallops.  Respiratory: Clear to auscultation bilaterally. GI: Soft, nontender, non-distended  MS: 2+ bilateral LE edema  Assessment & Plan   64 year old female with history of COPD, tobacco use, chronic bilateral lower extremity edema on Lasix , and obesity who presented with worsening lower extremity edema and dyspnea.   Acute hypoxic/hypercapnic respiratory failure with underlying cor pulmonale Acute on Chronic HFpEF -- CT angio was negative for PE, but elevated right heart pressure and pulmonary arterial hypertension, small right and trace left pleural effusions -- echo 1/11 with LVEF of 60-65%, no rWMA, increased RV pressure and volume overload, moderately enlarged RV -- on IV lasix  80mg  BID, net - 6.3L, weight down 322>>299. Cr stable  -- remains confused/combative on precedex . Was not tolerating Bipap mask this morning and  transitioned to [place] @3l  -- steroids/nebs/antibiotics per primary   Per primary Rectal bleeding Polycythemia   For questions or updates, please contact [facility] Please consult [email or web address] for contact info under         Signed, Manuelita Rummer, NP   "

## 2024-02-24 NOTE — Progress Notes (Signed)
 Initial Nutrition Assessment  DOCUMENTATION CODES:  Obesity unspecified  INTERVENTION:  Monitor for ability to resume oral intake Recommend 2 gram sodium diet when able to resume Consider protein containing snacks BID between meals If unable to resume a diet within 3-5 days; recommend Cortrak placement and initiation of enteral nutrition  NUTRITION DIAGNOSIS:  Inadequate oral intake related to acute illness as evidenced by NPO status.  GOAL:  Patient will meet greater than or equal to 90% of their needs  MONITOR:  Labs, Weight trends, Diet advancement, I & O's  REASON FOR ASSESSMENT:  Consult Assessment of nutrition requirement/status  ASSESSMENT:  Pt admitted with c/o edema and respiratory distress r/t acute on chronic COPD. PMH significant for COPD, tobacco use, rectal bleeding.  Echo with preserved EF but with RV enlargement.    Pt on BiPAP and resting at time of visit. Spoke with pt's daughter at bedside. She reports that pt had a good appetite prior to admission. Her last meal intake was around lunch time yesterday.   Prior to admission, pt's daughter reports that she had a great appetite. She typically eats 2 meals per day with many snacks in between. Although she feels her frequency of intake is the same, she has noticed the quantity of intake has reduced by about half. She is uncertain what to attribute this to.   Pt's daughter reports lots of weight fluctuation within the last year d/t being on diuretics.   Of note, pt admitted with volume overload. Dry weight unknown. Initiated on diuretic regimen.  Unfortunately there is limited weights on file to review within the last year to follow weight trends.  Admit weight: 141 kg Current weight: 136 kg  + edema: non-pitting generalized, deep pitting BLE  UOP: x24 hours I/O's: - since admit  Medications: lasix  80mg  BID Drips: Abx x2  Labs: last updated 1/11 Phosphorus 4.8 Albumin  2.9 AST 53 CBG's  95-110 x24 hours  NUTRITION - FOCUSED PHYSICAL EXAM: Deferred given pt resting comfortably on sedation following combativeness this morning.   Diet Order:   Diet Order             Diet NPO time specified  Diet effective now                   EDUCATION NEEDS:   Education needs have been addressed  Skin:  Skin Assessment: Reviewed RN Assessment  Last BM:  unknown/PTA  Height:  Ht Readings from Last 1 Encounters:  02/22/24 5' 7 (1.702 m)    Weight:  Wt Readings from Last 1 Encounters:  02/24/24 136 kg    Ideal Body Weight:  61.4 kg  BMI:  Body mass index is 46.96 kg/m.  Estimated Nutritional Needs:   Kcal:  1600-1800  Protein:  95-110g  Fluid:  >/=1.6L  Allie Kadijah Shamoon, RDN, LDN Clinical Nutrition See AMiON for contact information.

## 2024-02-24 NOTE — Progress Notes (Signed)
 OT Cancellation Note  Patient Details Name: Stephanie Knight MRN: [id number] DOB: [birth year]   Cancelled Treatment:    Reason Eval/Treat Not Completed: Medical issues which prohibited therapy. Per RN, pt combative and confused with poor tolerance for BiPAP mask. Will follow up as pt medically ready.   Stephanie Knight, OTR/L [facility] Acute Rehabilitation Office: [phone]   Stephanie JONETTA Lebron 02/24/2024, 11:26 AM

## 2024-02-24 NOTE — Progress Notes (Signed)
 PT Cancellation Note  Patient Details Name: Stephanie Knight MRN: [id number] DOB: Dec 23, 1960   Cancelled Treatment:    Reason Eval/Treat Not Completed: Medical issues which prohibited therapy (Per RN pt combative and confused with poor tolerance for BiPAP mask. Will follow up as able and appropriate.)  [name], DPT, CLT  Acute Rehabilitation Services Office: [phone] (Secure chat preferred)   [name] 02/24/2024, 1:25 PM

## 2024-02-25 ENCOUNTER — Inpatient Hospital Stay (HOSPITAL_COMMUNITY)

## 2024-02-25 ENCOUNTER — Encounter (HOSPITAL_COMMUNITY): Payer: Self-pay | Admitting: Internal Medicine

## 2024-02-25 DIAGNOSIS — E662 Morbid (severe) obesity with alveolar hypoventilation: Secondary | ICD-10-CM

## 2024-02-25 DIAGNOSIS — F17210 Nicotine dependence, cigarettes, uncomplicated: Secondary | ICD-10-CM

## 2024-02-25 DIAGNOSIS — I5030 Unspecified diastolic (congestive) heart failure: Secondary | ICD-10-CM

## 2024-02-25 DIAGNOSIS — J9602 Acute respiratory failure with hypercapnia: Secondary | ICD-10-CM

## 2024-02-25 LAB — POCT I-STAT 7, (LYTES, BLD GAS, ICA,H+H)
Acid-Base Excess: 15 mmol/L — ABNORMAL HIGH (ref 0.0–2.0)
Acid-Base Excess: 14 mmol/L — ABNORMAL HIGH (ref 0.0–2.0)
Bicarbonate: 44.8 mmol/L — ABNORMAL HIGH (ref 20.0–28.0)
Bicarbonate: 43 mmol/L — ABNORMAL HIGH (ref 20.0–28.0)
Calcium, Ion: 1.19 mmol/L (ref 1.15–1.40)
Calcium, Ion: 1.17 mmol/L (ref 1.15–1.40)
HCT: 51 % — ABNORMAL HIGH (ref 36.0–46.0)
HCT: 50 % — ABNORMAL HIGH (ref 36.0–46.0)
Hemoglobin: 17.3 g/dL — ABNORMAL HIGH (ref 12.0–15.0)
Hemoglobin: 17 g/dL — ABNORMAL HIGH (ref 12.0–15.0)
O2 Saturation: 92 %
O2 Saturation: 91 %
Patient temperature: 97.6
Patient temperature: 97
Potassium: 4.3 mmol/L (ref 3.5–5.1)
Potassium: 4 mmol/L (ref 3.5–5.1)
Sodium: 141 mmol/L (ref 135–145)
Sodium: 140 mmol/L (ref 135–145)
TCO2: 47 mmol/L — ABNORMAL HIGH (ref 22–32)
TCO2: 45 mmol/L — ABNORMAL HIGH (ref 22–32)
pCO2 arterial: 71.8 mmHg (ref 32–48)
pCO2 arterial: 63.2 mmHg — ABNORMAL HIGH (ref 32–48)
pH, Arterial: 7.401 (ref 7.35–7.45)
pH, Arterial: 7.437 (ref 7.35–7.45)
pO2, Arterial: 65 mmHg — ABNORMAL LOW (ref 83–108)
pO2, Arterial: 60 mmHg — ABNORMAL LOW (ref 83–108)

## 2024-02-25 LAB — GLUCOSE, CAPILLARY
Glucose-Capillary: 134 mg/dL — ABNORMAL HIGH (ref 70–99)
Glucose-Capillary: 146 mg/dL — ABNORMAL HIGH (ref 70–99)
Glucose-Capillary: 138 mg/dL — ABNORMAL HIGH (ref 70–99)
Glucose-Capillary: 145 mg/dL — ABNORMAL HIGH (ref 70–99)
Glucose-Capillary: 147 mg/dL — ABNORMAL HIGH (ref 70–99)

## 2024-02-25 LAB — BASIC METABOLIC PANEL WITH GFR
Anion gap: 8 (ref 5–15)
BUN: 24 mg/dL — ABNORMAL HIGH (ref 8–23)
CO2: 41 mmol/L — ABNORMAL HIGH (ref 22–32)
Calcium: 9 mg/dL (ref 8.9–10.3)
Chloride: 97 mmol/L — ABNORMAL LOW (ref 98–111)
Creatinine, Ser: 1.05 mg/dL — ABNORMAL HIGH (ref 0.44–1.00)
GFR, Estimated: 58 mL/min — ABNORMAL LOW
Glucose, Bld: 122 mg/dL — ABNORMAL HIGH (ref 70–99)
Potassium: 4.5 mmol/L (ref 3.5–5.1)
Sodium: 146 mmol/L — ABNORMAL HIGH (ref 135–145)

## 2024-02-25 LAB — MAGNESIUM: Magnesium: 1.8 mg/dL (ref 1.7–2.4)

## 2024-02-25 LAB — PHOSPHORUS: Phosphorus: 5 mg/dL — ABNORMAL HIGH (ref 2.5–4.6)

## 2024-02-25 MED ORDER — METHYLPREDNISOLONE SODIUM SUCC 40 MG IJ SOLR
40.0000 mg | Freq: Every day | INTRAMUSCULAR | Status: AC
  Administered 2024-02-25 – 2024-02-28 (×4): 40 mg via INTRAVENOUS
  Filled 2024-02-25 (×4): qty 1

## 2024-02-25 MED ORDER — OLANZAPINE 10 MG IM SOLR: 5.0000 mg | Freq: Four times a day (QID) | INTRAMUSCULAR | Status: DC

## 2024-02-25 MED ORDER — CHLORHEXIDINE GLUCONATE CLOTH 2 % EX PADS
6.0000 | MEDICATED_PAD | Freq: Every day | CUTANEOUS | Status: DC
  Administered 2024-02-25 – 2024-02-29 (×5): 6 via TOPICAL

## 2024-02-25 MED ORDER — MAGNESIUM SULFATE 2 GM/50ML IV SOLN
2.0000 g | Freq: Once | INTRAVENOUS | Status: AC
  Administered 2024-02-25: 2 g via INTRAVENOUS
  Filled 2024-02-25: qty 50

## 2024-02-25 MED ORDER — ACETAZOLAMIDE SODIUM 500 MG IJ SOLR
500.0000 mg | Freq: Once | INTRAMUSCULAR | Status: AC
  Administered 2024-02-25: 500 mg via INTRAVENOUS
  Filled 2024-02-25: qty 500

## 2024-02-25 MED ORDER — OLANZAPINE 10 MG IM SOLR
5.0000 mg | Freq: Four times a day (QID) | INTRAMUSCULAR | Status: DC
  Administered 2024-02-25 – 2024-02-26 (×7): 5 mg via INTRAMUSCULAR
  Filled 2024-02-25 (×10): qty 10

## 2024-02-25 NOTE — TOC Progression Note (Signed)
 Transition of Care [facility]) - Progression Note    Patient Details  Name: Stephanie Knight MRN: [id number] Date of Birth: Nov 04, 1960  Transition of Care [facility]) CM/SW Contact  Tom-Johnson, Yanni Quiroa Daphne, RN Phone Number: 02/25/2024, 3:54 PM  Clinical Narrative:     Smoking cessation resources on AVS.  CM will continue to follow.                     Expected Discharge Plan and Services                                               Social Drivers of Health (SDOH) Interventions SDOH Screenings   Food Insecurity: No Food Insecurity (02/22/2024)  Housing: Low Risk (07/15/2023)   Received from [facility]  Transportation Needs: No Transportation Needs (07/15/2023)   Received from [facility]  Utilities: Low Risk (07/15/2023)   Received from [facility]  Financial Resource Strain: Low Risk  (01/16/2023)   Received from [facility]  Tobacco Use: High Risk (02/25/2024)    Readmission Risk Interventions    02/24/2024    1:06 PM  Readmission Risk Prevention Plan  Post Dischage Appt Complete  Medication Screening Complete  Transportation Screening Complete

## 2024-02-25 NOTE — Progress Notes (Signed)
 "  NAME:  Stephanie Knight, MRN:  [id number], DOB:  [birth year], LOS: 3 ADMISSION DATE:  02/22/2024, CONSULTATION DATE:  02/23/2024 REFERRING MD:  Charlton DASEN., CHIEF COMPLAINT:  AECOPD on BiPAP/encephalopathy   History of Present Illness:  Stephanie Knight is a 64 yo female with past medical history significant for COPD, tobacco use, rectal bleeding who presented 02/22/24 from [facility] for BLE edema and respiratory distress. The patient was placed on BiPAP for hypercapnia with minimal improvement and admitted to [facility]. Labs notable for polycythemia, BNP 1,948, sCr 1.02. She has been afebrile and normotensive. Further workup included a CTA that was negative for PE, but showed right heart enlargement. Echo with preserved EF, but with RV enlargement and septal flattening; cardiology was consulted and patient was started on diuretics. PCCM consulted for ICU admission for persistent encephalopathy now requiring initiation of precedex  for BiPAP compliance.   Of note, patient was recently seen by her pulmonologist Dr. Theotis and was started on home O2 and lasix , however patient had yet to start treatment. At that visit, it was also noted that she had rectal bleeding and had recently completed an abx course for UTI.   Pertinent  Medical History       Past Medical History:  Diagnosis Date   Acute cor pulmonale without pulmonary embolism (HCC)     Anemia      with pregnancy    COPD (chronic obstructive pulmonary disease) (HCC)     PONV (postoperative nausea and vomiting)     Tobacco abuse     Significant Hospital Events: Including procedures, antibiotic start and stop dates in addition to other pertinent events   1/10: Admit [facility], AECOPD, HFpEF->BiPAP +diuresis 1/11: ICU admission; dex gtt for BiPAP compliance  Interim History / Subjective:  Refused BiPAP overnight, required Precedex , and Zyprexa  and had to be restrained Still on Lasix  80 twice daily, making good output urine 2 L overnight Still  encephalopathic, AO X1 Repeat ABG this a.m.,   Objective    Blood pressure 126/76, pulse (!) 49, temperature 98.4 F (36.9 C), temperature source Axillary, resp. rate (!) 26, height 5' 7 (1.702 m), weight 132.8 kg, SpO2 96%.    Vent Mode: BIPAP;PSV FiO2 (%):  [40 %] 40 % Set Rate:  [18 bmp] 18 bmp PEEP:  [5 cmH20] 5 cmH20   Intake/Output Summary (Last 24 hours) at 02/25/2024 0731 Last data filed at 02/25/2024 0536 Gross per 24 hour  Intake 1446.15 ml  Output 3550 ml  Net -2103.85 ml   Filed Weights   02/23/24 0900 02/24/24 0700 02/25/24 0333  Weight: (!) 146.4 kg 136 kg 132.8 kg    Examination: General: Morbid obese, lying in bed, in mild distress somnolent HEENT: NCAT Lungs: Symmetrical chest wall movement, mild wheezes CVS: S1-S2 normal, no murmur Abdomen: Soft nontender, distended abdomen Extremities: 2+ pitting edema Neuro: Somnolent, following commands GU: Deferred   Resolved problem list   Assessment and Plan   64 year old female with history of COPD (recently prescribed home oxygen), tobacco use, rectal bleeding who presents with SOB due to acute on chronic cor pulmonale   Acute hypoxic hypercapnic respiratory failure-on BiPAP Acute on chronic cor pulmonale COPD OHS HFpEF  echo 02/23/2024: EF 60-65%, no regional wall motion abnormalities, & ventricular septum flattened in systolic and diastolic consistent with RV pressure and volume overload.  Normal RV systolic function.  Moderately increased RV size & wall thickness.  Dilated IVC with less than 50% CTA: Negative for PE, right heart enlargement Dry  weight in the past about 20lbs less than now    Plan - Will schedule Zyprexa , to try to wean off Precedex  - Will try again BiPAP once off restraint - ABG pending - Will likely need qhs BiPAP, and with naps - Continue diuresis 80 twice daily - Will use 1 dose of Diamox  today for bicarb - Will also treat as COPD exacerbation - Solu-Medrol , ceftriaxone ,  Doxy(Azithromycin not available) - Continue breathing treatment(LAMA/LABA/ICS) - Monitor electrolytes and replace - Strict I&O's     Labs   CBC: Recent Labs  Lab 02/22/24 1357 02/23/24 0135 02/24/24 1041  WBC 8.5 8.3  --   NEUTROABS  --  5.5  --   HGB 14.6 14.1 15.6*  HCT 48.9* 48.7* 46.0  MCV 80.4 82.8  --   PLT 329 297  --     Basic Metabolic Panel: Recent Labs  Lab 02/22/24 1357 02/22/24 1923 02/23/24 0135 02/24/24 1041 02/25/24 0419  NA 143  --  140 138 146*  K 4.1  --  4.8 4.1 4.5  CL 102  --  99  --  97*  CO2 34*  --  34*  --  41*  GLUCOSE 94  --  97  --  122*  BUN 23  --  19  --  24*  CREATININE 1.02*  --  0.95  --  1.05*  CALCIUM 8.5*  --  8.3*  --  9.0  MG  --  1.8 1.7  --  1.8  PHOS  --   --  4.8*  --  5.0*   GFR: Estimated Creatinine Clearance: 75 mL/min (A) (by C-G formula based on SCr of 1.05 mg/dL (H)). Recent Labs  Lab 02/22/24 1357 02/23/24 0135  WBC 8.5 8.3    Liver Function Tests: Recent Labs  Lab 02/22/24 1923 02/23/24 0135  AST 46* 53*  ALT 46* 41  ALKPHOS 93 81  BILITOT 0.6 0.7  PROT 5.9* 5.6*  ALBUMIN  3.0* 2.9*   No results for input(s): LIPASE, AMYLASE in the last 168 hours. No results for input(s): AMMONIA in the last 168 hours.  ABG    Component Value Date/Time   PHART 7.329 (L) 02/24/2024 1041   PCO2ART 82.0 (HH) 02/24/2024 1041   PO2ART 80 (L) 02/24/2024 1041   HCO3 42.9 (H) 02/24/2024 1041   TCO2 45 (H) 02/24/2024 1041   O2SAT 93 02/24/2024 1041     Coagulation Profile: No results for input(s): INR, PROTIME in the last 168 hours.  Cardiac Enzymes: No results for input(s): CKTOTAL, CKMB, CKMBINDEX, TROPONINI in the last 168 hours.  HbA1C: Hgb A1c MFr Bld  Date/Time Value Ref Range Status  04/08/2019 10:51 AM 5.5 4.8 - 5.6 % Final    Comment:             Prediabetes: 5.7 - 6.4          Diabetes: >6.4          Glycemic control for adults with diabetes: <7.0     CBG: Recent Labs   Lab 02/24/24 0810 02/24/24 1148 02/24/24 1602 02/24/24 2319 02/25/24 0312  GLUCAP 110* 128* 152* 143* 134*    Review of Systems:   Negative except above  Past Medical History:  She,  has a past medical history of Acute cor pulmonale without pulmonary embolism (HCC), Anemia, COPD (chronic obstructive pulmonary disease) (HCC), PONV (postoperative nausea and vomiting), and Tobacco abuse.   Surgical History:   Past Surgical History:  Procedure Laterality Date  ORIF ANKLE FRACTURE Right 05/03/2016   Procedure: OPEN REDUCTION INTERNAL FIXATION RIGHT (ORIF) ANKLE FRACTURE WEIGHT BEARING DISTAL TIBIA WITH FIXATION TIBIA AND FIBULA;  Surgeon: Evalene JONETTA Chancy, MD;  Location: [facility];  Service: Orthopedics;  Laterality: Right;   Right blocked tear duct       Social History:   reports that she has been smoking cigarettes. She has a 22 pack-year smoking history. She has never used smokeless tobacco. She reports current alcohol use. She reports that she does not currently use drugs after having used the following drugs: Marijuana.   Family History:  Her family history includes Dementia in her mother; Hypertension in her father, mother, and sister. There is no history of Colon cancer, Colon polyps, Esophageal cancer, Rectal cancer, or Stomach cancer.   Allergies Allergies[1]   Home Medications  Prior to Admission medications  Medication Sig Start Date End Date Taking? Authorizing Provider  albuterol  (VENTOLIN  HFA) 108 (90 Base) MCG/ACT inhaler Inhale 2 puffs into the lungs every 6 (six) hours as needed for wheezing or shortness of breath. Patient not taking: Reported on 02/23/2024 02/18/24   [provider]  budesonide -glycopyrrolate -formoterol  (BREZTRI  AEROSPHERE) 160-9-4.8 MCG/ACT AERO inhaler Inhale 2 puffs into the lungs daily. Patient not taking: Reported on 02/23/2024 12/05/23 12/04/24  [provider]  ciprofloxacin (CIPRO) 500 MG tablet Take 500 mg  by mouth 2 (two) times daily. for 7 days Patient not taking: Reported on 02/23/2024 02/18/24 02/25/24  [provider]  Fluticasone-Umeclidin-Vilant (TRELEGY ELLIPTA) 100-62.5-25 MCG/ACT AEPB Inhale 1 puff into the lungs daily. Patient not taking: Reported on 02/23/2024 10/09/22   [provider]  furosemide  (LASIX ) 40 MG tablet Take 40 mg by mouth daily. Patient not taking: Reported on 02/23/2024 07/31/23   [provider]  HYDROcodone -acetaminophen  (NORCO/VICODIN) 5-325 MG tablet Take 1 tablet by mouth every 6 (six) hours as needed. Patient not taking: Reported on 02/23/2024 11/19/23   [provider]  hydrOXYzine (ATARAX) 25 MG tablet Take 25 mg by mouth 3 (three) times daily as needed. Patient not taking: Reported on 02/23/2024    [provider]  meclizine  (ANTIVERT ) 12.5 MG tablet Take 1 tablet (12.5 mg total) by mouth 3 (three) times daily as needed for dizziness. Patient not taking: Reported on 02/23/2024 01/30/22   Rush Nest, MD  Naproxen Sodium (ALEVE) 220 MG CAPS Take 2 capsules by mouth daily as needed (pain). Patient not taking: Reported on 01/30/2022    [provider]  tirzepatide (ZEPBOUND) 2.5 MG/0.5ML Pen Inject 2.5 mg into the skin once a week. Patient not taking: Reported on 02/23/2024 07/16/23   [provider]           Lenny Drought, MD   Pulmonary Critical Care Prefer epic messenger for cross cover needs   My critical care time: 35 minutes  Critical care time was exclusive of separately billable procedures and treating other patients.  Critical care was necessary to treat or prevent imminent or life-threatening deterioration.  Critical care was time spent personally by me on the following activities: development of treatment plan with patient and/or surrogate as well as nursing, discussions with consultants, evaluation of patient's response to treatment, examination of patient, obtaining history  from patient or surrogate, ordering and performing treatments and interventions, ordering and review of laboratory studies, ordering and review of radiographic studies, pulse oximetry, re-evaluation of patient's condition and participation in multidisciplinary rounds.              [1]  Allergies Allergen Reactions   Aspirin  Nausea And Vomiting   "
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
## 2024-02-25 NOTE — Progress Notes (Signed)
 OT Cancellation Note  Patient Details Name: Stephanie Knight MRN: [id number] DOB: [birth year]   Cancelled Treatment:    Reason Eval/Treat Not Completed: Medical issues which prohibited therapy;Other (comment) pt remains agitated with RN asking to hold. OT to follow up as pt medically ready and appropriate.   [name], OTR/L [facility] Acute Rehabilitation Office: [phone]   Elma JONETTA Lebron 02/25/2024, 1:03 PM

## 2024-02-25 NOTE — Evaluation (Signed)
 RT Evaluate and Treat Note  02/25/2024   Breathing is (select one): Same as normal    The following was found on auscultation (select multiple):  Bilateral Breath Sounds: Diminished;Rhonchi (02/25/24 1600)  R Upper  Breath Sounds: Diminished;Rhonchi (02/25/24 1600) L Upper Breath Sounds: Diminished;Rhonchi (02/25/24 1600) R Lower Breath Sounds: Diminished;Rhonchi (02/25/24 1600) L Lower Breath Sounds: Diminished;Rhonchi (02/25/24 1600)    Cough Assessment: Cough: None (02/25/24 1600)    Most Recent Chest Xray:... (DG CHEST PORT 1 VIEW Result Date: 02/25/2024 CLINICAL DATA:  Respiratory failure EXAM: PORTABLE CHEST 1 VIEW COMPARISON:  Yesterday FINDINGS: Stable cardiomegaly with mild central pulmonary vascular congestion. Minimal bilateral pulmonary edema may be present. Bony thorax is unremarkable. IMPRESSION: Stable cardiomegaly with mild central pulmonary vascular congestion. Minimal bilateral pulmonary edema may be present. Electronically Signed   By: Lynwood Landy Raddle M.D.   On: 02/25/2024 12:18      The following medications and/or interventions were ordered/changed/discontinued as part of the Respiratory Treatment protocol:   Medication Changes: no changes    Airway Clearance Changes: no changes    Oxygen Therapy Changes: no changes

## 2024-02-25 NOTE — Evaluation (Signed)
 Clinical/Bedside Swallow Evaluation Patient Details  Name: Stephanie Knight MRN: [id number] Date of Birth: 08-Jul-1960  Today's Date: 02/25/2024 Time: SLP Start Time (ACUTE ONLY): 1127 SLP Stop Time (ACUTE ONLY): 1140 SLP Time Calculation (min) (ACUTE ONLY): 13 min  Past Medical History:  Past Medical History:  Diagnosis Date   Acute cor pulmonale without pulmonary embolism (HCC)    Anemia    with pregnancy    COPD (chronic obstructive pulmonary disease) (HCC)    PONV (postoperative nausea and vomiting)    Tobacco abuse    Past Surgical History:  Past Surgical History:  Procedure Laterality Date   ORIF ANKLE FRACTURE Right 05/03/2016   Procedure: OPEN REDUCTION INTERNAL FIXATION RIGHT (ORIF) ANKLE FRACTURE WEIGHT BEARING DISTAL TIBIA WITH FIXATION TIBIA AND FIBULA;  Surgeon: Evalene JONETTA Chancy, MD;  Location: [facility];  Service: Orthopedics;  Laterality: Right;   Right blocked tear duct     HPI:  Pt is a 64 y.o female presented to [facility] ED 1/10 for BLE edema. Found to be hypoxic and transferred to [facility]. Workup for potential CHF. CT findings consistent with RV dysfunction. Elevated CO2 levels requiring BiPAP, however, becoming agitated with mask and poor tolerance started on dex for compliance. PMH: COPD, tobacco abuse    Assessment / Plan / Recommendation  Clinical Impression  Will start with a full liquid diet given pt's mentation. She will require full supervision with PO intake. Anticipate ability to resume regular diet with decreased agitation and sustained attention with reduced cueing.   Pt presents with AMS, needing support from family and SLP to sequence functional tasks. One instance of immediate coughing was observed but did not recur in an subsequent trials, including when challenged with 3 oz consecutively. Oral transit of solids fluctuates significanlty from one bite to the next. At times, mastication is efficient and at others, she needs extensive cueing  to initiate mastication. Discussed with pt's son and RN.   SLP Visit Diagnosis: Dysphagia, unspecified (R13.10)    Aspiration Risk  Mild aspiration risk    Diet Recommendation           Other Recommendations Oral Care Recommendations: Oral care BID     Swallow Evaluation Recommendations Recommendations: PO diet PO Diet Recommendation: Full liquid diet Liquid Administration via: Spoon;Cup;Straw Medication Administration: Whole meds with liquid Supervision: Full assist for feeding;Full supervision/cueing for swallowing strategies Swallowing strategies  : Minimize environmental distractions;Slow rate;Small bites/sips Postural changes: Position pt fully upright for meals Oral care recommendations: Oral care BID (2x/day)   Assistance Recommended at Discharge    Functional Status Assessment Patient has had a recent decline in their functional status and demonstrates the ability to make significant improvements in function in a reasonable and predictable amount of time.  Frequency and Duration min 2x/week  1 week       Prognosis Prognosis for improved oropharyngeal function: Good Barriers to Reach Goals: Cognitive deficits      Swallow Study   General HPI: Pt is a 64 y.o female presented to [facility] ED 1/10 for BLE edema. Found to be hypoxic and transferred to [facility]. Workup for potential CHF. CT findings consistent with RV dysfunction. Elevated CO2 levels requiring BiPAP, however, becoming agitated with mask and poor tolerance started on dex for compliance. PMH: COPD, tobacco abuse Type of Study: Bedside Swallow Evaluation Previous Swallow Assessment: none in chart Diet Prior to this Study: NPO Temperature Spikes Noted: No Respiratory Status: Nasal cannula History of Recent Intubation: No Behavior/Cognition: Alert;Distractible;Requires cueing  Oral Cavity Assessment: Within Functional Limits Oral Care Completed by SLP: No Oral Cavity - Dentition: Dentures, top;Missing  dentition Vision: Functional for self-feeding Self-Feeding Abilities: Total assist Patient Positioning: Upright in bed Baseline Vocal Quality: Normal Volitional Cough: Strong Volitional Swallow: Able to elicit    Oral/Motor/Sensory Function Overall Oral Motor/Sensory Function: Within functional limits   Ice Chips Ice chips: Not tested   Thin Liquid Thin Liquid: Impaired Presentation: Straw Pharyngeal  Phase Impairments: Throat Clearing - Delayed    Nectar Thick Nectar Thick Liquid: Not tested   Honey Thick Honey Thick Liquid: Not tested   Puree Puree: Not tested   Solid     Solid: Impaired Oral Phase Functional Implications: Prolonged oral transit      Damien Blumenthal, M.A., CCC-SLP Speech Language Pathology, Acute Rehabilitation Services  Secure Chat preferred [phone]  02/25/2024,1:11 PM

## 2024-02-25 NOTE — Progress Notes (Signed)
 PT Cancellation Note  Patient Details Name: Stephanie Knight MRN: [id number] DOB: Dec 21, 1960   Cancelled Treatment:    Reason Eval/Treat Not Completed: Other (comment);Medical issues which prohibited therapy (Pt continues to remain very agitated. RN asking to hold at this time. Will follow up as able and appropriate.)  Stephanie Knight, DPT, CLT  Acute Rehabilitation Services Office: [phone] (Secure chat preferred)   Stephanie Knight 02/25/2024, 12:47 PM

## 2024-02-26 LAB — GLUCOSE, CAPILLARY
Glucose-Capillary: 105 mg/dL — ABNORMAL HIGH (ref 70–99)
Glucose-Capillary: 114 mg/dL — ABNORMAL HIGH (ref 70–99)
Glucose-Capillary: 121 mg/dL — ABNORMAL HIGH (ref 70–99)
Glucose-Capillary: 123 mg/dL — ABNORMAL HIGH (ref 70–99)
Glucose-Capillary: 89 mg/dL (ref 70–99)
Glucose-Capillary: 94 mg/dL (ref 70–99)

## 2024-02-26 LAB — BASIC METABOLIC PANEL WITH GFR
Anion gap: 8 (ref 5–15)
BUN: 31 mg/dL — ABNORMAL HIGH (ref 8–23)
CO2: 39 mmol/L — ABNORMAL HIGH (ref 22–32)
Calcium: 8.8 mg/dL — ABNORMAL LOW (ref 8.9–10.3)
Chloride: 98 mmol/L (ref 98–111)
Creatinine, Ser: 1.06 mg/dL — ABNORMAL HIGH (ref 0.44–1.00)
GFR, Estimated: 58 mL/min — ABNORMAL LOW
Glucose, Bld: 109 mg/dL — ABNORMAL HIGH (ref 70–99)
Potassium: 4.2 mmol/L (ref 3.5–5.1)
Sodium: 145 mmol/L (ref 135–145)

## 2024-02-26 LAB — MAGNESIUM: Magnesium: 1.9 mg/dL (ref 1.7–2.4)

## 2024-02-26 MED ORDER — FUROSEMIDE 10 MG/ML IJ SOLN
80.0000 mg | Freq: Once | INTRAMUSCULAR | Status: AC
  Administered 2024-02-26: 80 mg via INTRAVENOUS
  Filled 2024-02-26: qty 8

## 2024-02-26 MED ORDER — ORAL CARE MOUTH RINSE
15.0000 mL | OROMUCOSAL | Status: DC | PRN
  Administered 2024-02-29: 15 mL via OROMUCOSAL

## 2024-02-26 MED ORDER — DEXTROSE 5 % IV SOLN
500.0000 mg | Freq: Once | INTRAVENOUS | Status: AC
  Administered 2024-02-26: 500 mg via INTRAVENOUS
  Filled 2024-02-26: qty 500

## 2024-02-26 MED ORDER — VALPROATE SODIUM 100 MG/ML IV SOLN
500.0000 mg | Freq: Two times a day (BID) | INTRAVENOUS | Status: DC
  Administered 2024-02-26 (×2): 500 mg via INTRAVENOUS
  Filled 2024-02-26: qty 500
  Filled 2024-02-26 (×5): qty 5

## 2024-02-26 MED ORDER — ORAL CARE MOUTH RINSE
15.0000 mL | OROMUCOSAL | Status: DC
  Administered 2024-02-26 – 2024-03-02 (×18): 15 mL via OROMUCOSAL

## 2024-02-26 MED ORDER — STERILE WATER FOR INJECTION IJ SOLN
INTRAMUSCULAR | Status: AC
  Filled 2024-02-26: qty 10

## 2024-02-26 NOTE — Plan of Care (Signed)
  Problem: Education: Goal: Knowledge of General Education information will improve Description: Including pain rating scale, medication(s)/side effects and non-pharmacologic comfort measures Outcome: Progressing   Problem: Health Behavior/Discharge Planning: Goal: Ability to manage health-related needs will improve Outcome: Progressing   Problem: Clinical Measurements: Goal: Ability to maintain clinical measurements within normal limits will improve Outcome: Progressing Goal: Will remain free from infection Outcome: Progressing Goal: Diagnostic test results will improve Outcome: Progressing Goal: Respiratory complications will improve Outcome: Progressing Goal: Cardiovascular complication will be avoided Outcome: Progressing   Problem: Activity: Goal: Risk for activity intolerance will decrease Outcome: Progressing   Problem: Nutrition: Goal: Adequate nutrition will be maintained Outcome: Progressing   Problem: Coping: Goal: Level of anxiety will decrease Outcome: Progressing   Problem: Elimination: Goal: Will not experience complications related to bowel motility Outcome: Progressing Goal: Will not experience complications related to urinary retention Outcome: Progressing   Problem: Pain Managment: Goal: General experience of comfort will improve and/or be controlled Outcome: Progressing   Problem: Safety: Goal: Ability to remain free from injury will improve Outcome: Progressing   Problem: Skin Integrity: Goal: Risk for impaired skin integrity will decrease Outcome: Progressing   Problem: Education: Goal: Ability to demonstrate management of disease process will improve Outcome: Progressing Goal: Ability to verbalize understanding of medication therapies will improve Outcome: Progressing Goal: Individualized Educational Video(s) Outcome: Progressing   Problem: Activity: Goal: Capacity to carry out activities will improve Outcome: Progressing    Problem: Cardiac: Goal: Ability to achieve and maintain adequate cardiopulmonary perfusion will improve Outcome: Progressing   Problem: Safety: Goal: Non-violent Restraint(s) Outcome: Progressing

## 2024-02-26 NOTE — Progress Notes (Signed)
 RT attempted ABG, attempt unsuccessful due to patient agitation. MD aware.

## 2024-02-26 NOTE — Progress Notes (Signed)
 PT Cancellation Note  Patient Details Name: Stephanie Knight MRN: [id number] DOB: August 28, 1960   Cancelled Treatment:    Reason Eval/Treat Not Completed: Other (comment) (Pt continues to remain very agitated. RN asking to hold. Will sign off at this time. Please re-consult when pt is appropriate or if further needs arise.)  Stephanie Knight, DPT, CLT  Acute Rehabilitation Services Office: [phone] (Secure chat preferred)   Stephanie Knight 02/26/2024, 5:13 PM

## 2024-02-26 NOTE — Evaluation (Signed)
 RT Evaluate and Treat Note  02/26/2024   Breathing is (select one): Same as normal    The following was found on auscultation (select multiple):  Bilateral Breath Sounds: Diminished;Rhonchi (02/26/24 0814)  R Upper  Breath Sounds: Rhonchi (02/26/24 0814) L Upper Breath Sounds: Rhonchi (02/26/24 0814) R Lower Breath Sounds: Diminished (02/26/24 0814) L Lower Breath Sounds: Diminished (02/26/24 0814)    Cough Assessment: Cough: None (02/25/24 1600)    Most Recent Chest Xray:... (DG CHEST PORT 1 VIEW Result Date: 02/25/2024 CLINICAL DATA:  Respiratory failure EXAM: PORTABLE CHEST 1 VIEW COMPARISON:  Yesterday FINDINGS: Stable cardiomegaly with mild central pulmonary vascular congestion. Minimal bilateral pulmonary edema may be present. Bony thorax is unremarkable. IMPRESSION: Stable cardiomegaly with mild central pulmonary vascular congestion. Minimal bilateral pulmonary edema may be present. Electronically Signed   By: Lynwood Landy Raddle M.D.   On: 02/25/2024 12:18      The following medications and/or interventions were ordered/changed/discontinued as part of the Respiratory Treatment protocol:   Medication Changes: None   Airway Clearance Changes: None   Oxygen Therapy Changes:None

## 2024-02-26 NOTE — Progress Notes (Signed)
 RT placed PT on BIPAP. Possibly wore it for 10-15 minutes before patient was aggressively ripping the mask off. BIPAP left at bedside. RT will continue to monitor.

## 2024-02-26 NOTE — Progress Notes (Signed)
 "  NAME:  TREMEKA HELBLING, MRN:  [id number], DOB:  [birth year], LOS: 4 ADMISSION DATE:  02/22/2024, CONSULTATION DATE:  02/23/2024 REFERRING MD:  Charlton DASEN., CHIEF COMPLAINT:  AECOPD on BiPAP/encephalopathy   History of Present Illness:  Lynsay Fesperman is a 64 yo female with past medical history significant for COPD, tobacco use, rectal bleeding who presented 02/22/24 from [facility] for BLE edema and respiratory distress. The patient was placed on BiPAP for hypercapnia with minimal improvement and admitted to [facility]. Labs notable for polycythemia, BNP 1,948, sCr 1.02. She has been afebrile and normotensive. Further workup included a CTA that was negative for PE, but showed right heart enlargement. Echo with preserved EF, but with RV enlargement and septal flattening; cardiology was consulted and patient was started on diuretics. PCCM consulted for ICU admission for persistent encephalopathy now requiring initiation of precedex  for BiPAP compliance.   Of note, patient was recently seen by her pulmonologist Dr. Theotis and was started on home O2 and lasix , however patient had yet to start treatment. At that visit, it was also noted that she had rectal bleeding and had recently completed an abx course for UTI.   Pertinent  Medical History       Past Medical History:  Diagnosis Date   Acute cor pulmonale without pulmonary embolism (HCC)     Anemia      with pregnancy    COPD (chronic obstructive pulmonary disease) (HCC)     PONV (postoperative nausea and vomiting)     Tobacco abuse     Significant Hospital Events: Including procedures, antibiotic start and stop dates in addition to other pertinent events   1/10: Admit [facility], AECOPD, HFpEF->BiPAP +diuresis 1/11: ICU admission; dex gtt for BiPAP compliance  Interim History / Subjective:  Agitated overnight, refused BiPAP overnight, required high level of Precedex , Zyprexa , and restraint. Will add valproic  acid 500 twice daily today  With 650 mL of UOP  overnight, continue Lasix  80 daily, Diamox  500 Still encephalopathic, AO X1 Repeat ABG this a.m.   Objective    Blood pressure 119/69, pulse (!) 49, temperature 97.8 F (36.6 C), temperature source Oral, resp. rate (!) 24, height 5' 7 (1.702 m), weight 127.9 kg, SpO2 96%.    FiO2 (%):  [50 %] 50 %   Intake/Output Summary (Last 24 hours) at 02/26/2024 0739 Last data filed at 02/26/2024 0600 Gross per 24 hour  Intake 1327.97 ml  Output 3570 ml  Net -2242.03 ml   Filed Weights   02/24/24 0700 02/25/24 0333 02/26/24 0500  Weight: 136 kg 132.8 kg 127.9 kg    Examination: General: Morbid obese, lying in bed, not in distress, somnolent HEENT: NCAT Lungs: Symmetrical chest wall movement, CVS: S1-S2 normal, no murmur Abdomen: Soft nontender nondistended abdomen Extremities: 2+ edema Neuro: Somnolent, following commands GU: Deferred     Resolved problem list   Assessment and Plan   64 year old female with history of COPD (recently prescribed home oxygen), tobacco use, rectal bleeding who presents with SOB due to acute on chronic cor pulmonale   Acute hypoxic hypercapnic respiratory failure-on BiPAP Acute on chronic cor pulmonale COPD [facility] HFpEF  echo 02/23/2024: EF 60-65%, no regional wall motion abnormalities, & ventricular septum flattened in systolic and diastolic consistent with RV pressure and volume overload.  Normal RV systolic function.  Moderately increased RV size & wall thickness.  Dilated IVC with less than 50% CTA: Negative for PE, right heart enlargement Dry weight in the past about 20lbs less than now  Plan - Added valproic  acid 500 twice daily, continue schedule Zyprexa , will try to wean off Precedex  - Will try again BiPAP on soft restraint - ABG pending - Will likely need qhs BiPAP, and with naps - Continue diuresis 80 mg daily and Diamox  500 - Will also treat as COPD exacerbation - Continue Solu-Medrol , ceftriaxone , Doxy(Azithromycin not  available) - Continue breathing treatment (LAMA/LABA/ICS) - Monitor electrolytes and replace - Strict I&O's     Labs   CBC: Recent Labs  Lab 02/22/24 1357 02/23/24 0135 02/24/24 1041 02/25/24 0902 02/25/24 2203  WBC 8.5 8.3  --   --   --   NEUTROABS  --  5.5  --   --   --   HGB 14.6 14.1 15.6* 17.3* 17.0*  HCT 48.9* 48.7* 46.0 51.0* 50.0*  MCV 80.4 82.8  --   --   --   PLT 329 297  --   --   --     Basic Metabolic Panel: Recent Labs  Lab 02/22/24 1357 02/22/24 1923 02/23/24 0135 02/24/24 1041 02/25/24 0419 02/25/24 0902 02/25/24 2203  NA 143  --  140 138 146* 141 140  K 4.1  --  4.8 4.1 4.5 4.3 4.0  CL 102  --  99  --  97*  --   --   CO2 34*  --  34*  --  41*  --   --   GLUCOSE 94  --  97  --  122*  --   --   BUN 23  --  19  --  24*  --   --   CREATININE 1.02*  --  0.95  --  1.05*  --   --   CALCIUM 8.5*  --  8.3*  --  9.0  --   --   MG  --  1.8 1.7  --  1.8  --   --   PHOS  --   --  4.8*  --  5.0*  --   --    GFR: Estimated Creatinine Clearance: 73.3 mL/min (A) (by C-G formula based on SCr of 1.05 mg/dL (H)). Recent Labs  Lab 02/22/24 1357 02/23/24 0135  WBC 8.5 8.3    Liver Function Tests: Recent Labs  Lab 02/22/24 1923 02/23/24 0135  AST 46* 53*  ALT 46* 41  ALKPHOS 93 81  BILITOT 0.6 0.7  PROT 5.9* 5.6*  ALBUMIN  3.0* 2.9*   No results for input(s): LIPASE, AMYLASE in the last 168 hours. No results for input(s): AMMONIA in the last 168 hours.  ABG    Component Value Date/Time   PHART 7.437 02/25/2024 2203   PCO2ART 63.2 (H) 02/25/2024 2203   PO2ART 60 (L) 02/25/2024 2203   HCO3 43.0 (H) 02/25/2024 2203   TCO2 45 (H) 02/25/2024 2203   O2SAT 91 02/25/2024 2203     Coagulation Profile: No results for input(s): INR, PROTIME in the last 168 hours.  Cardiac Enzymes: No results for input(s): CKTOTAL, CKMB, CKMBINDEX, TROPONINI in the last 168 hours.  HbA1C: Hgb A1c MFr Bld  Date/Time Value Ref Range Status   04/08/2019 10:51 AM 5.5 4.8 - 5.6 % Final    Comment:             Prediabetes: 5.7 - 6.4          Diabetes: >6.4          Glycemic control for adults with diabetes: <7.0     CBG: Recent Labs  Lab  02/25/24 0743 02/25/24 1126 02/25/24 1543 02/25/24 1946 02/26/24 0327  GLUCAP 146* 138* 145* 147* 105*    Review of Systems:   Negative except above  Past Medical History:  She,  has a past medical history of Acute cor pulmonale without pulmonary embolism (HCC), Anemia, COPD (chronic obstructive pulmonary disease) (HCC), PONV (postoperative nausea and vomiting), and Tobacco abuse.   Surgical History:   Past Surgical History:  Procedure Laterality Date   ORIF ANKLE FRACTURE Right 05/03/2016   Procedure: OPEN REDUCTION INTERNAL FIXATION RIGHT (ORIF) ANKLE FRACTURE WEIGHT BEARING DISTAL TIBIA WITH FIXATION TIBIA AND FIBULA;  Surgeon: Evalene JONETTA Chancy, MD;  Location: [facility];  Service: Orthopedics;  Laterality: Right;   Right blocked tear duct       Social History:   reports that she has been smoking cigarettes. She has a 22 pack-year smoking history. She has never used smokeless tobacco. She reports current alcohol use. She reports that she does not currently use drugs after having used the following drugs: Marijuana.   Family History:  Her family history includes Dementia in her mother; Hypertension in her father, mother, and sister. There is no history of Colon cancer, Colon polyps, Esophageal cancer, Rectal cancer, or Stomach cancer.   Allergies Allergies[1]   Home Medications  Prior to Admission medications  Medication Sig Start Date End Date Taking? Authorizing Provider  albuterol  (VENTOLIN  HFA) 108 (90 Base) MCG/ACT inhaler Inhale 2 puffs into the lungs every 6 (six) hours as needed for wheezing or shortness of breath. Patient not taking: Reported on 02/23/2024 02/18/24   [provider]  budesonide -glycopyrrolate -formoterol  (BREZTRI  AEROSPHERE)  160-9-4.8 MCG/ACT AERO inhaler Inhale 2 puffs into the lungs daily. Patient not taking: Reported on 02/23/2024 12/05/23 12/04/24  [provider]  ciprofloxacin (CIPRO) 500 MG tablet Take 500 mg by mouth 2 (two) times daily. for 7 days Patient not taking: Reported on 02/23/2024 02/18/24 02/25/24  [provider]  Fluticasone-Umeclidin-Vilant (TRELEGY ELLIPTA) 100-62.5-25 MCG/ACT AEPB Inhale 1 puff into the lungs daily. Patient not taking: Reported on 02/23/2024 10/09/22   [provider]  furosemide  (LASIX ) 40 MG tablet Take 40 mg by mouth daily. Patient not taking: Reported on 02/23/2024 07/31/23   [provider]  HYDROcodone -acetaminophen  (NORCO/VICODIN) 5-325 MG tablet Take 1 tablet by mouth every 6 (six) hours as needed. Patient not taking: Reported on 02/23/2024 11/19/23   [provider]  hydrOXYzine (ATARAX) 25 MG tablet Take 25 mg by mouth 3 (three) times daily as needed. Patient not taking: Reported on 02/23/2024    [provider]  meclizine  (ANTIVERT ) 12.5 MG tablet Take 1 tablet (12.5 mg total) by mouth 3 (three) times daily as needed for dizziness. Patient not taking: Reported on 02/23/2024 01/30/22   Rush Nest, MD  Naproxen Sodium (ALEVE) 220 MG CAPS Take 2 capsules by mouth daily as needed (pain). Patient not taking: Reported on 01/30/2022    [provider]  tirzepatide (ZEPBOUND) 2.5 MG/0.5ML Pen Inject 2.5 mg into the skin once a week. Patient not taking: Reported on 02/23/2024 07/16/23   [provider]           Lenny Drought, MD  [facility] Prefer epic messenger for cross cover needs   My critical care time: 30 minutes  Critical care time was exclusive of separately billable procedures and treating other patients.  Critical care was necessary to treat or prevent imminent or life-threatening deterioration.  Critical care was time spent personally by me  on the following  activities: development of treatment plan with patient and/or surrogate as well as nursing, discussions with consultants, evaluation of patient's response to treatment, examination of patient, obtaining history from patient or surrogate, ordering and performing treatments and interventions, ordering and review of laboratory studies, ordering and review of radiographic studies, pulse oximetry, re-evaluation of patient's condition and participation in multidisciplinary rounds.              [1]  Allergies Allergen Reactions   Aspirin  Nausea And Vomiting   "

## 2024-02-27 LAB — GLUCOSE, CAPILLARY
Glucose-Capillary: 68 mg/dL — ABNORMAL LOW (ref 70–99)
Glucose-Capillary: 93 mg/dL (ref 70–99)
Glucose-Capillary: 122 mg/dL — ABNORMAL HIGH (ref 70–99)

## 2024-02-27 LAB — BASIC METABOLIC PANEL WITH GFR
Anion gap: 8 (ref 5–15)
BUN: 27 mg/dL — ABNORMAL HIGH (ref 8–23)
CO2: 37 mmol/L — ABNORMAL HIGH (ref 22–32)
Calcium: 8.7 mg/dL — ABNORMAL LOW (ref 8.9–10.3)
Chloride: 97 mmol/L — ABNORMAL LOW (ref 98–111)
Creatinine, Ser: 0.95 mg/dL (ref 0.44–1.00)
GFR, Estimated: >60 mL/min
Glucose, Bld: 77 mg/dL (ref 70–99)
Potassium: 4 mmol/L (ref 3.5–5.1)
Sodium: 142 mmol/L (ref 135–145)

## 2024-02-27 LAB — MAGNESIUM: Magnesium: 1.9 mg/dL (ref 1.7–2.4)

## 2024-02-27 MED ORDER — POLYETHYLENE GLYCOL 3350 17 G PO PACK
17.0000 g | PACK | Freq: Two times a day (BID) | ORAL | Status: DC
  Administered 2024-02-27 – 2024-02-28 (×2): 17 g via ORAL
  Filled 2024-02-27 (×6): qty 1

## 2024-02-27 MED ORDER — DIVALPROEX SODIUM 250 MG PO DR TAB
500.0000 mg | DELAYED_RELEASE_TABLET | Freq: Two times a day (BID) | ORAL | Status: DC
  Administered 2024-02-27 – 2024-02-28 (×4): 500 mg via ORAL
  Filled 2024-02-27 (×4): qty 2

## 2024-02-27 MED ORDER — DOXYCYCLINE HYCLATE 100 MG PO TABS
100.0000 mg | ORAL_TABLET | Freq: Two times a day (BID) | ORAL | Status: AC
  Administered 2024-02-27 – 2024-02-28 (×4): 100 mg via ORAL
  Filled 2024-02-27 (×4): qty 1

## 2024-02-27 MED ORDER — ALBUMIN HUMAN 5 % IV SOLN
25.0000 g | Freq: Once | INTRAVENOUS | Status: AC
  Administered 2024-02-27: 12.5 g via INTRAVENOUS
  Filled 2024-02-27: qty 500

## 2024-02-27 MED ORDER — SENNOSIDES-DOCUSATE SODIUM 8.6-50 MG PO TABS
1.0000 | ORAL_TABLET | Freq: Two times a day (BID) | ORAL | Status: DC
  Administered 2024-02-27 – 2024-02-29 (×4): 1 via ORAL
  Filled 2024-02-27 (×7): qty 1

## 2024-02-27 MED ORDER — OLANZAPINE 5 MG PO TABS
5.0000 mg | ORAL_TABLET | Freq: Four times a day (QID) | ORAL | Status: DC
  Administered 2024-02-27 – 2024-02-28 (×6): 5 mg via ORAL
  Filled 2024-02-27 (×10): qty 1

## 2024-02-27 NOTE — Progress Notes (Signed)
 Speech Language Pathology Treatment: Dysphagia  Patient Details Name: Stephanie Knight MRN: [id number] DOB: [birth year] Today's Date: 02/27/2024 Time: [id number] SLP Time Calculation (min) (ACUTE ONLY): 8 min  Assessment / Plan / Recommendation Clinical Impression  Pt is alert and oriented, sitting upright in the chair and feeding herself PM meal. Diet was advanced to regular overnight. No s/s of dysphagia or aspiration noted throughout. Given improved mentation, continue current diet without ongoing SLP f/u.    HPI HPI: Pt is a 64 y.o female presented to [facility] ED 1/10 for BLE edema. Found to be hypoxic and transferred to [facility]. Workup for potential CHF. CT findings consistent with RV dysfunction. Elevated CO2 levels requiring BiPAP, however, becoming agitated with mask and poor tolerance started on dex for compliance. PMH: COPD, tobacco abuse      SLP Plan  All goals met        Swallow Evaluation Recommendations   Recommendations: PO diet PO Diet Recommendation: Regular;Thin liquids (Level 0) Liquid Administration via: Cup;Straw Medication Administration: Whole meds with liquid Supervision: Patient able to self-feed Postural changes: Position pt fully upright for meals Oral care recommendations: Oral care BID (2x/day)     Recommendations                     Oral care BID   None Dysphagia, unspecified (R13.10)     All goals met     Damien Blumenthal, M.A., CCC-SLP Speech Language Pathology, Acute Rehabilitation Services  Secure Chat preferred [phone]   02/27/2024, 1:09 PM

## 2024-02-27 NOTE — Progress Notes (Addendum)
 "  NAME:  Stephanie Knight, MRN:  [id number], DOB:  06-05-1960, LOS: 5 ADMISSION DATE:  02/22/2024, CONSULTATION DATE:  02/23/2024 REFERRING MD:  Charlton DASEN., CHIEF COMPLAINT:  AECOPD on BiPAP/encephalopathy   History of Present Illness:  Stephanie Knight is a 64 yo female with past medical history significant for COPD, tobacco use, rectal bleeding who presented 02/22/24 from [facility] for BLE edema and respiratory distress. The patient was placed on BiPAP for hypercapnia with minimal improvement and admitted to [facility]. Labs notable for polycythemia, BNP 1,948, sCr 1.02. She has been afebrile and normotensive. Further workup included a CTA that was negative for PE, but showed right heart enlargement. Echo with preserved EF, but with RV enlargement and septal flattening; cardiology was consulted and patient was started on diuretics. PCCM consulted for ICU admission for persistent encephalopathy now requiring initiation of precedex  for BiPAP compliance.   Of note, patient was recently seen by her pulmonologist Dr. Theotis and was started on home O2 and lasix , however patient had yet to start treatment. At that visit, it was also noted that she had rectal bleeding and had recently completed an abx course for UTI.   Pertinent  Medical History       Past Medical History:  Diagnosis Date   Acute cor pulmonale without pulmonary embolism (HCC)     Anemia      with pregnancy    COPD (chronic obstructive pulmonary disease) (HCC)     PONV (postoperative nausea and vomiting)     Tobacco abuse     Significant Hospital Events: Including procedures, antibiotic start and stop dates in addition to other pertinent events   1/10: Admit [facility], AECOPD, HFpEF->BiPAP +diuresis 1/11: ICU admission; dex gtt for BiPAP compliance  Interim History / Subjective:  Improving Mentation, AO X3, almost back to baseline, ABG 7.43/63, history of BiPAP overnight Hypotensive likely due to overdiuresis, improved with albumin  With 600 mL  of urine overnight Ambulating with PT OT    Objective    Blood pressure (!) 111/48, pulse 85, temperature (!) 97.4 F (36.3 C), temperature source Axillary, resp. rate (!) 39, height 5' 7 (1.702 m), weight 127.9 kg, SpO2 96%.    FiO2 (%):  [50 %] 50 % PEEP:  [6 cmH20] 6 cmH20   Intake/Output Summary (Last 24 hours) at 02/27/2024 0751 Last data filed at 02/27/2024 0400 Gross per 24 hour  Intake 1861.55 ml  Output 4850 ml  Net -2988.45 ml   Filed Weights   02/24/24 0700 02/25/24 0333 02/26/24 0500  Weight: 136 kg 132.8 kg 127.9 kg    Examination: General: Morbid obese, lying in bed, not in distress HEENT: NCAT Lungs: Symmetrical chest wall movement, CVS: S1-S2 normal, no murmur Abdomen: Soft nontender,distended abdomen Extremities: 2+ edema Neuro: AO X3, following commands GU: Deferred   Resolved problem list   Assessment and Plan   64 year old female with history of COPD (recently prescribed home oxygen), tobacco use, rectal bleeding who presents with SOB due to acute on chronic cor pulmonale   Acute hypoxic hypercapnic respiratory failure-on BiPAP Acute on chronic cor pulmonale COPD OHS HFpEF  echo 02/23/2024: EF 60-65%, no regional wall motion abnormalities, & ventricular septum flattened in systolic and diastolic consistent with RV pressure and volume overload.  Normal RV systolic function.  Moderately increased RV size & wall thickness.  Dilated IVC with less than 50% CTA: Negative for PE, right heart enlargement Dry weight in the past about 20lbs less than now     Plan -  Off Precedex , continue valproic  acid 500 twice daily, continue scheduled Zyprexa , changed to p.o. - BiPAP at night, with naps - Continue pulse oximetry, cardiac monitoring - Hold diuresis, hypotension improved with albumin . - Can give more albumin  if needed - Continue Solu-Medrol , ceftriaxone , Doxy(Azithromycin not available) - Continue breathing treatment (LAMA/LABA/ICS) - Monitor  electrolytes and replace - Strict I&O's - Continue PT OT - Will transfer to PCU       Labs   CBC: Recent Labs  Lab 02/22/24 1357 02/23/24 0135 02/24/24 1041 02/25/24 0902 02/25/24 2203  WBC 8.5 8.3  --   --   --   NEUTROABS  --  5.5  --   --   --   HGB 14.6 14.1 15.6* 17.3* 17.0*  HCT 48.9* 48.7* 46.0 51.0* 50.0*  MCV 80.4 82.8  --   --   --   PLT 329 297  --   --   --     Basic Metabolic Panel: Recent Labs  Lab 02/22/24 1357 02/22/24 1923 02/23/24 0135 02/24/24 1041 02/25/24 0419 02/25/24 0902 02/25/24 2203 02/26/24 0933 02/27/24 0235  NA 143  --  140   < > 146* 141 140 145 142  K 4.1  --  4.8   < > 4.5 4.3 4.0 4.2 4.0  CL 102  --  99  --  97*  --   --  98 97*  CO2 34*  --  34*  --  41*  --   --  39* 37*  GLUCOSE 94  --  97  --  122*  --   --  109* 77  BUN 23  --  19  --  24*  --   --  31* 27*  CREATININE 1.02*  --  0.95  --  1.05*  --   --  1.06* 0.95  CALCIUM 8.5*  --  8.3*  --  9.0  --   --  8.8* 8.7*  MG  --  1.8 1.7  --  1.8  --   --  1.9 1.9  PHOS  --   --  4.8*  --  5.0*  --   --   --   --    < > = values in this interval not displayed.   GFR: Estimated Creatinine Clearance: 81 mL/min (by C-G formula based on SCr of 0.95 mg/dL). Recent Labs  Lab 02/22/24 1357 02/23/24 0135  WBC 8.5 8.3    Liver Function Tests: Recent Labs  Lab 02/22/24 1923 02/23/24 0135  AST 46* 53*  ALT 46* 41  ALKPHOS 93 81  BILITOT 0.6 0.7  PROT 5.9* 5.6*  ALBUMIN  3.0* 2.9*   No results for input(s): LIPASE, AMYLASE in the last 168 hours. No results for input(s): AMMONIA in the last 168 hours.  ABG    Component Value Date/Time   PHART 7.437 02/25/2024 2203   PCO2ART 63.2 (H) 02/25/2024 2203   PO2ART 60 (L) 02/25/2024 2203   HCO3 43.0 (H) 02/25/2024 2203   TCO2 45 (H) 02/25/2024 2203   O2SAT 91 02/25/2024 2203     Coagulation Profile: No results for input(s): INR, PROTIME in the last 168 hours.  Cardiac Enzymes: No results for input(s):  CKTOTAL, CKMB, CKMBINDEX, TROPONINI in the last 168 hours.  HbA1C: Hgb A1c MFr Bld  Date/Time Value Ref Range Status  04/08/2019 10:51 AM 5.5 4.8 - 5.6 % Final    Comment:  Prediabetes: 5.7 - 6.4          Diabetes: >6.4          Glycemic control for adults with diabetes: <7.0     CBG: Recent Labs  Lab 02/26/24 1537 02/26/24 1949 02/26/24 2330 02/27/24 0327 02/27/24 0419  GLUCAP 121* 123* 89 68* 93    Review of Systems:   Negative except above  Past Medical History:  She,  has a past medical history of Acute cor pulmonale without pulmonary embolism (HCC), Anemia, COPD (chronic obstructive pulmonary disease) (HCC), PONV (postoperative nausea and vomiting), and Tobacco abuse.   Surgical History:   Past Surgical History:  Procedure Laterality Date   ORIF ANKLE FRACTURE Right 05/03/2016   Procedure: OPEN REDUCTION INTERNAL FIXATION RIGHT (ORIF) ANKLE FRACTURE WEIGHT BEARING DISTAL TIBIA WITH FIXATION TIBIA AND FIBULA;  Surgeon: Evalene JONETTA Chancy, MD;  Location: [facility];  Service: Orthopedics;  Laterality: Right;   Right blocked tear duct       Social History:   reports that she has been smoking cigarettes. She has a 22 pack-year smoking history. She has never used smokeless tobacco. She reports current alcohol use. She reports that she does not currently use drugs after having used the following drugs: Marijuana.   Family History:  Her family history includes Dementia in her mother; Hypertension in her father, mother, and sister. There is no history of Colon cancer, Colon polyps, Esophageal cancer, Rectal cancer, or Stomach cancer.   Allergies Allergies[1]   Home Medications  Prior to Admission medications  Medication Sig Start Date End Date Taking? Authorizing Provider  albuterol  (VENTOLIN  HFA) 108 (90 Base) MCG/ACT inhaler Inhale 2 puffs into the lungs every 6 (six) hours as needed for wheezing or shortness of breath. Patient not  taking: Reported on 02/23/2024 02/18/24   [provider]  budesonide -glycopyrrolate -formoterol  (BREZTRI  AEROSPHERE) 160-9-4.8 MCG/ACT AERO inhaler Inhale 2 puffs into the lungs daily. Patient not taking: Reported on 02/23/2024 12/05/23 12/04/24  [provider]  ciprofloxacin (CIPRO) 500 MG tablet Take 500 mg by mouth 2 (two) times daily. for 7 days Patient not taking: Reported on 02/23/2024 02/18/24 02/25/24  [provider]  Fluticasone-Umeclidin-Vilant (TRELEGY ELLIPTA) 100-62.5-25 MCG/ACT AEPB Inhale 1 puff into the lungs daily. Patient not taking: Reported on 02/23/2024 10/09/22   [provider]  furosemide  (LASIX ) 40 MG tablet Take 40 mg by mouth daily. Patient not taking: Reported on 02/23/2024 07/31/23   [provider]  HYDROcodone -acetaminophen  (NORCO/VICODIN) 5-325 MG tablet Take 1 tablet by mouth every 6 (six) hours as needed. Patient not taking: Reported on 02/23/2024 11/19/23   [provider]  hydrOXYzine (ATARAX) 25 MG tablet Take 25 mg by mouth 3 (three) times daily as needed. Patient not taking: Reported on 02/23/2024    [provider]  meclizine  (ANTIVERT ) 12.5 MG tablet Take 1 tablet (12.5 mg total) by mouth 3 (three) times daily as needed for dizziness. Patient not taking: Reported on 02/23/2024 01/30/22   Rush Nest, MD  Naproxen Sodium (ALEVE) 220 MG CAPS Take 2 capsules by mouth daily as needed (pain). Patient not taking: Reported on 01/30/2022    [provider]  tirzepatide (ZEPBOUND) 2.5 MG/0.5ML Pen Inject 2.5 mg into the skin once a week. Patient not taking: Reported on 02/23/2024 07/16/23   [provider]           Lenny Drought, MD   Pulmonary Critical Care Prefer epic messenger for cross cover needs   My critical  care time: 35 minutes  Critical care time was exclusive of separately billable procedures and treating other patients.  Critical care was necessary to treat or  prevent imminent or life-threatening deterioration.  Critical care was time spent personally by me on the following activities: development of treatment plan with patient and/or surrogate as well as nursing, discussions with consultants, evaluation of patient's response to treatment, examination of patient, obtaining history from patient or surrogate, ordering and performing treatments and interventions, ordering and review of laboratory studies, ordering and review of radiographic studies, pulse oximetry, re-evaluation of patient's condition and participation in multidisciplinary rounds.              [1]  Allergies Allergen Reactions   Aspirin  Nausea And Vomiting   "

## 2024-02-27 NOTE — Evaluation (Signed)
 Physical Therapy Evaluation Patient Details Name: Stephanie Knight MRN: [id number] DOB: 27-Aug-1960 Today's Date: 02/27/2024  History of Present Illness  Pt is a 64 y.o female presented to [facility] ED 1/10 for BLE edema. Found to be hypoxic and transferred to [facility]. Workup for potential CHF. CT findings consistent with RV dysfunction. Elevated CO2 levels requiring BiPAP, however, becoming agitated with mask and poor tolerance started on dex for compliance. UTI. PMH: COPD, tobacco abuse  Clinical Impression  Pt currently presenting at Min A for bed mobility, Min A to CGA for sit to stand with 2 people that 2 people at [facility] for short distance gait. Son is good support and able to assist pt at home. Due to pt current functional status, home set up and available assistance at home recommending skilled physical therapy services 3x/week in order to address strength, balance and functional mobility to decrease risk for falls, injury and re-hospitalization.         If plan is discharge home, recommend the following: A little help with walking and/or transfers;Help with stairs or ramp for entrance;Assist for transportation;Assistance with cooking/housework     Equipment Recommendations Rolling walker (2 wheels);BSC/3in1     Functional Status Assessment Patient has had a recent decline in their functional status and demonstrates the ability to make significant improvements in function in a reasonable and predictable amount of time.     Precautions / Restrictions Precautions Precautions: Fall Precaution/Restrictions Comments: watch BP & O2 Restrictions Weight Bearing Restrictions Per Provider Order: No      Mobility  Bed Mobility Overal bed mobility: Needs Assistance Bed Mobility: Supine to Sit     Supine to sit: Min assist     General bed mobility comments: for trunk to mid line    Transfers Overall transfer level: Needs assistance Equipment used: Rolling walker (2 wheels) Transfers:  Sit to/from Stand Sit to Stand: Min assist, Contact guard assist           General transfer comment: min A +2 fro initial STS, progressed to CGA. cues for hand placement    Ambulation/Gait Ambulation/Gait assistance: Contact guard assist, +2 physical assistance, +2 safety/equipment Gait Distance (Feet): 30 Feet Assistive device: Rolling walker (2 wheels) Gait Pattern/deviations: Step-through pattern, Decreased stride length Gait velocity: decreased Gait velocity interpretation: <1.8 ft/sec, indicate of risk for recurrent falls   General Gait Details: Pt is slightly unstable on her feet. 2 person assist due to lines/leads. Pt was with short length step reciprocal gait pattern with minimal heel/toe gait pattern. and Min to mod UE support on RW     Balance Overall balance assessment: Needs assistance Sitting-balance support: Feet supported Sitting balance-Leahy Scale: Good     Standing balance support: Bilateral upper extremity supported, During functional activity Standing balance-Leahy Scale: Poor Standing balance comment: pt anxious to stand without BUE supported       Pertinent Vitals/Pain Pain Assessment Pain Assessment: No/denies pain    Home Living Family/patient expects to be discharged to:: Private residence Living Arrangements: Children Available Help at Discharge: Family;Available 24 hours/day Type of Home: House Home Access: Stairs to enter   [employer] of Steps: 1 or 3 Alternate Level Stairs-Number of Steps: 2 Home Layout: Two level (2 steps between levels) Home Equipment: None Additional Comments: lives in an in-law suite of her son's home, son is there 24/7 if needed. They have seperate living/kitchen spaces but the pt often spends time in his space.    Prior Function Prior Level of Function :  Independent/Modified Independent             Mobility Comments: indep, no AD ADLs Comments: indep, drives, does not work     Extremity/Trunk  Assessment   Upper Extremity Assessment Upper Extremity Assessment: Defer to OT evaluation    Lower Extremity Assessment Lower Extremity Assessment: Generalized weakness    Cervical / Trunk Assessment Cervical / Trunk Assessment: Normal  Communication   Communication Communication: No apparent difficulties    Cognition Arousal: Alert Behavior During Therapy: WFL for tasks assessed/performed   PT - Cognitive impairments: No apparent impairments       Following commands: Intact       Cueing Cueing Techniques: Verbal cues     General Comments General comments (skin integrity, edema, etc.): Unable to get an accurate BP on pt durign session. Spo2 88-94% on 5-6L. BP at end of session 75/48; Pt denied dizziness/lightheadedness. RN aware.        Assessment/Plan    PT Assessment Patient needs continued PT services  PT Problem List Decreased strength;Decreased range of motion;Decreased activity tolerance;Decreased balance;Decreased mobility       PT Treatment Interventions DME instruction;Therapeutic exercise;Gait training;Balance training;Stair training;Functional mobility training;Therapeutic activities;Patient/family education    PT Goals (Current goals can be found in the Care Plan section)  Acute Rehab PT Goals Patient Stated Goal: to improve mobility and get back to independence. PT Goal Formulation: With patient Time For Goal Achievement: 03/12/24 Potential to Achieve Goals: Good    Frequency Min 2X/week     Co-evaluation PT/OT/SLP Co-Evaluation/Treatment: Yes Reason for Co-Treatment: Complexity of the patient's impairments (multi-system involvement);For patient/therapist safety;To address functional/ADL transfers PT goals addressed during session: Mobility/safety with mobility;Balance;Proper use of DME OT goals addressed during session: ADL's and self-care       AM-PAC PT 6 Clicks Mobility  Outcome Measure Help needed turning from your back to your  side while in a flat bed without using bedrails?: A Little Help needed moving from lying on your back to sitting on the side of a flat bed without using bedrails?: A Little Help needed moving to and from a bed to a chair (including a wheelchair)?: A Lot Help needed standing up from a chair using your arms (e.g., wheelchair or bedside chair)?: A Lot Help needed to walk in hospital room?: A Lot Help needed climbing 3-5 steps with a railing? : A Lot 6 Click Score: 14    End of Session Equipment Utilized During Treatment: Gait belt Activity Tolerance: Patient tolerated treatment well Patient left: with call bell/phone within reach;in chair;with chair alarm set Nurse Communication: Mobility status PT Visit Diagnosis: Unsteadiness on feet (R26.81);Other abnormalities of gait and mobility (R26.89)    Time: 8991-8948 PT Time Calculation (min) (ACUTE ONLY): 43 min   Charges:   PT Evaluation $PT Eval Low Complexity: 1 Low   PT General Charges $$ ACUTE PT VISIT: 1 Visit         [name], DPT, CLT  Acute Rehabilitation Services Office: [phone] (Secure chat preferred)   [name] 02/27/2024, 3:26 PM

## 2024-02-27 NOTE — Evaluation (Signed)
 Occupational Therapy Evaluation Patient Details Name: Stephanie Knight MRN: [id number] DOB: 07-16-1960 Today's Date: 02/27/2024   History of Present Illness   Pt is a 64 y.o female presented to [facility] ED 1/10 for BLE edema. Found to be hypoxic and transferred to [facility] Workup for potential CHF. CT findings consistent with RV dysfunction. Elevated CO2 levels requiring BiPAP, however, becoming agitated with mask and poor tolerance started on dex for compliance. UTI. PMH: COPD, tobacco abuse     Clinical Impressions Stephanie Knight was evaluated s/p the above admission list. She is indep at baseline. Upon evaluation the pt was limited by increased O2 needs, low BP (asymptomatic), weakness, balance and limited activity tolerance. Overall she needed min A for bed mobility, min A +2 to stand initally and CGA for mobility with RW. Due to the deficits listed below the pt also needs mod A for LB ADLs and min A for UB ADLs. Pt will benefit from continued acute OT services and HHOT.      If plan is discharge home, recommend the following:   A little help with walking and/or transfers;A little help with bathing/dressing/bathroom;Assistance with cooking/housework;Assist for transportation;Help with stairs or ramp for entrance     Functional Status Assessment   Patient has had a recent decline in their functional status and demonstrates the ability to make significant improvements in function in a reasonable and predictable amount of time.     Equipment Recommendations   Other (comment) (RW, BSC pending progress)      Precautions/Restrictions   Precautions Precautions: Fall Precaution/Restrictions Comments: watch BP & O2 Restrictions Weight Bearing Restrictions Per Provider Order: No     Mobility Bed Mobility Overal bed mobility: Needs Assistance Bed Mobility: Supine to Sit     Supine to sit: Min assist          Transfers Overall transfer level: Needs assistance Equipment used:  Rolling walker (2 wheels) Transfers: Sit to/from Stand Sit to Stand: Min assist, Contact guard assist           General transfer comment: min A +2 fro initial STS, progressed to CGA. cues for hand placement      Balance Overall balance assessment: Needs assistance Sitting-balance support: Feet supported Sitting balance-Leahy Scale: Good     Standing balance support: Bilateral upper extremity supported, During functional activity Standing balance-Leahy Scale: Poor Standing balance comment: pt anxious to stand without BUE supported         ADL either performed or assessed with clinical judgement   ADL Overall ADL's : Needs assistance/impaired Eating/Feeding: Independent   Grooming: Set up;Sitting   Upper Body Bathing: Set up;Sitting   Lower Body Bathing: Moderate assistance   Upper Body Dressing : Set up;Sitting   Lower Body Dressing: Sit to/from stand;Moderate assistance   Toilet Transfer: Minimal Market Researcher Details (indicate cue type and reason): SP to [facility] due to urgency         Functional mobility during ADLs: Minimal assistance General ADL Comments: assist for safety, actvity tolerance, balance     Vision Baseline Vision/History: 0 No visual deficits Vision Assessment?: No apparent visual deficits     Perception Perception: Within Functional Limits       Praxis Praxis: WFL       Pertinent Vitals/Pain Pain Assessment Pain Assessment: No/denies pain     Extremity/Trunk Assessment Upper Extremity Assessment Upper Extremity Assessment: Overall WFL for tasks assessed   Lower Extremity Assessment Lower Extremity Assessment: Defer to PT evaluation   Cervical /  Trunk Assessment Cervical / Trunk Assessment: Normal   Communication Communication Communication: No apparent difficulties   Cognition Arousal: Alert Behavior During Therapy: WFL for tasks assessed/performed Cognition: No apparent impairments              OT - Cognition Comments: would benefit from a hgih level assessment                 Following commands: Intact       Cueing  General Comments   Cueing Techniques: Verbal cues  Unable to get an accurate BP on pt at the start of the session. SpO2 88-94% on 5-6L. Bp at the end of the session 75/48, RN aware           Home Living Family/patient expects to be discharged to:: Private residence Living Arrangements: Children Available Help at Discharge: Family;Available 24 hours/day Type of Home: House Home Access: Stairs to enter [employer] of Steps: 1 or 3   Home Layout: Two level (2 steps between levels) Alternate Level Stairs-Number of Steps: 2   Bathroom Shower/Tub: Runner, Broadcasting/film/video: None          Prior Functioning/Environment Prior Level of Function : Independent/Modified Independent             Mobility Comments: indep, no AD ADLs Comments: indep, drives, does not work    OT Problem List: Decreased strength;Decreased activity tolerance;Decreased range of motion;Impaired balance (sitting and/or standing);Decreased safety awareness;Decreased knowledge of use of DME or AE;Decreased knowledge of precautions   OT Treatment/Interventions: Self-care/ADL training;Therapeutic exercise;Energy conservation;DME and/or AE instruction;Therapeutic activities;Patient/family education;Balance training      OT Goals(Current goals can be found in the care plan section)   Acute Rehab OT Goals Patient Stated Goal: home OT Goal Formulation: With patient Time For Goal Achievement: 02/27/24 Potential to Achieve Goals: Good ADL Goals Pt Will Perform Grooming: with supervision;standing Pt Will Perform Upper Body Dressing: with modified independence Pt Will Perform Lower Body Dressing: with supervision;sit to/from stand Pt Will Transfer to Toilet: with supervision;ambulating Additional ADL Goal #1: pt will tolerate at least 8  minutes of OOB functional activity to demonstrate improved endurance   OT Frequency:  Min 2X/week    Co-evaluation PT/OT/SLP Co-Evaluation/Treatment: Yes Reason for Co-Treatment: Complexity of the patient's impairments (multi-system involvement);For patient/therapist safety;To address functional/ADL transfers   OT goals addressed during session: ADL's and self-care      AM-PAC OT 6 Clicks Daily Activity     Outcome Measure Help from another person eating meals?: None Help from another person taking care of personal grooming?: A Little Help from another person toileting, which includes using toliet, bedpan, or urinal?: A Lot Help from another person bathing (including washing, rinsing, drying)?: A Lot Help from another person to put on and taking off regular upper body clothing?: A Little Help from another person to put on and taking off regular lower body clothing?: A Lot 6 Click Score: 16   End of Session Equipment Utilized During Treatment: Rolling walker (2 wheels);Gait belt;Oxygen Nurse Communication: Mobility status  Activity Tolerance: Patient tolerated treatment well Patient left: with call bell/phone within reach;in chair;with nursing/sitter in room;with family/visitor present  OT Visit Diagnosis: Unsteadiness on feet (R26.81);Other abnormalities of gait and mobility (R26.89);Muscle weakness (generalized) (M62.81)                Time: 1007-1050 OT Time Calculation (min): 43 min Charges:  OT General Charges $OT Visit: 1 Visit  OT Evaluation $OT Eval Moderate Complexity: 1 Mod OT Treatments $Self Care/Home Management : 8-22 mins  Lucie Kendall, OTR/L Acute Rehabilitation Services Office [phone] Secure Chat Communication Preferred   Lucie JONETTA Kendall 02/27/2024, 11:26 AM

## 2024-02-27 NOTE — Progress Notes (Signed)
 eLink Physician-Brief Progress Note Patient Name: JIMMYE WISNIESKI DOB: 05-Oct-1960 MRN: [id number]   Date of Service  02/27/2024  HPI/Events of Note  wondering if we can order a diet. mentation has imporved greatly and able to toleate off BiPap.  Asking becasue she has had some hypoglycemia.  66  having to give dextrose   Camera: On nasal o2. Awake, feeling fine.   eICU Interventions  Discussed with RN.AAO x4 Regular diet ordered, aspiration precautions.        Intervention Category Intermediate Interventions: Other:  Jodelle ONEIDA Hutching 02/27/2024, 4:18 AM

## 2024-02-27 NOTE — Evaluation (Signed)
 RT Evaluate and Treat Note  02/27/2024   Breathing is (select one): Same as normal    The following was found on auscultation (select multiple):  Bilateral Breath Sounds: Clear;Diminished (02/27/24 0754)  R Upper  Breath Sounds: Diminished (02/26/24 2000) L Upper Breath Sounds: Diminished (02/26/24 2000) R Lower Breath Sounds: Diminished;Rhonchi (02/26/24 2000) L Lower Breath Sounds: Diminished;Rhonchi (02/26/24 2000)    Cough Assessment: Cough: Strong;Productive (02/27/24 0400)    Most Recent Chest Xray:... (No results found.    The following medications and/or interventions were ordered/changed/discontinued as part of the Respiratory Treatment protocol:   Medication Changes: None    Airway Clearance Changes: None   Oxygen Therapy Changes:None

## 2024-02-28 LAB — BASIC METABOLIC PANEL WITH GFR
Anion gap: 5 (ref 5–15)
BUN: 22 mg/dL (ref 8–23)
CO2: 37 mmol/L — ABNORMAL HIGH (ref 22–32)
Calcium: 8.5 mg/dL — ABNORMAL LOW (ref 8.9–10.3)
Chloride: 101 mmol/L (ref 98–111)
Creatinine, Ser: 0.8 mg/dL (ref 0.44–1.00)
GFR, Estimated: >60 mL/min
Glucose, Bld: 105 mg/dL — ABNORMAL HIGH (ref 70–99)
Potassium: 3.7 mmol/L (ref 3.5–5.1)
Sodium: 143 mmol/L (ref 135–145)

## 2024-02-28 LAB — CBC WITH DIFFERENTIAL/PLATELET
Abs Immature Granulocytes: 0.01 K/uL (ref 0.00–0.07)
Basophils Absolute: 0 K/uL (ref 0.0–0.1)
Basophils Relative: 0 %
Eosinophils Absolute: 0 K/uL (ref 0.0–0.5)
Eosinophils Relative: 0 %
HCT: 48.5 % — ABNORMAL HIGH (ref 36.0–46.0)
Hemoglobin: 13.9 g/dL (ref 12.0–15.0)
Immature Granulocytes: 0 %
Lymphocytes Relative: 15 %
Lymphs Abs: 1.1 K/uL (ref 0.7–4.0)
MCH: 23.9 pg — ABNORMAL LOW (ref 26.0–34.0)
MCHC: 28.7 g/dL — ABNORMAL LOW (ref 30.0–36.0)
MCV: 83.3 fL (ref 80.0–100.0)
Monocytes Absolute: 0.5 K/uL (ref 0.1–1.0)
Monocytes Relative: 7 %
Neutro Abs: 5.5 K/uL (ref 1.7–7.7)
Neutrophils Relative %: 78 %
Platelets: 227 K/uL (ref 150–400)
RBC: 5.82 MIL/uL — ABNORMAL HIGH (ref 3.87–5.11)
RDW: 21.1 % — ABNORMAL HIGH (ref 11.5–15.5)
Smear Review: NORMAL
WBC: 7.1 K/uL (ref 4.0–10.5)
nRBC: 0 % (ref 0.0–0.2)

## 2024-02-28 MED ORDER — STERILE WATER FOR INJECTION IJ SOLN
INTRAMUSCULAR | Status: AC
  Filled 2024-02-28: qty 10

## 2024-02-28 MED ORDER — FUROSEMIDE 40 MG PO TABS: 40.0000 mg | ORAL_TABLET | Freq: Every day | ORAL | Status: DC

## 2024-02-28 MED ORDER — FUROSEMIDE 10 MG/ML IJ SOLN
40.0000 mg | Freq: Every day | INTRAMUSCULAR | Status: DC
  Administered 2024-02-28 – 2024-03-02 (×4): 40 mg via INTRAVENOUS
  Filled 2024-02-28 (×4): qty 4

## 2024-02-28 MED ORDER — CARVEDILOL 3.125 MG PO TABS
3.1250 mg | ORAL_TABLET | Freq: Two times a day (BID) | ORAL | Status: DC
  Administered 2024-02-28 – 2024-03-02 (×7): 3.125 mg via ORAL
  Filled 2024-02-28 (×8): qty 1

## 2024-02-28 NOTE — TOC CM/SW Note (Addendum)
 Transition of Care [facility]) - Inpatient Brief Assessment   Patient Details  Name: Stephanie Knight MRN: [id number] Date of Birth: 02-19-1960  Transition of Care [facility]) CM/SW Contact:    Waddell Barnie Rama, RN Phone Number: 02/28/2024, 12:32 PM   Clinical Narrative: From home with son, has no  PCP and has insurance on file,  she states she would like to see a doctor at the [facility] in [place] where she sees her pulmonologist, states has no HH services in place at this time , has rollator at home.  States family member  (son) will transport them home at [employer] and family is support system, states gets medications from [facility] on [address].  Pta self ambulatory with rollator.   Per pt eval rec HHPT, HHOT, NCM offered choice, patient chose [facility].  NCM sent referral thru portal. She does not have a preference for the agency for DME.  PT rec rolling walker and BSC.  NCM sent referral to [facility] thru portal. Also Dr. Lavelle Servant , Pulmonologist at the [facility] will be able to sign off on any HH orders for patient.  Monitor for oxygen needs ([facility]).    Transition of Care Asessment: Insurance and Status: Insurance coverage has been reviewed Patient has primary care physician: No Home environment has been reviewed: lives with son Prior level of function:: ambulatory with rollator Prior/Current Home Services: Current home services (rollator) Social Drivers of Health Review: SDOH reviewed no interventions necessary Readmission risk has been reviewed: Yes Transition of care needs: transition of care needs identified, TOC will continue to follow

## 2024-02-28 NOTE — Plan of Care (Signed)
" °  Problem: Education: Goal: Knowledge of General Education information will improve Description: Including pain rating scale, medication(s)/side effects and non-pharmacologic comfort measures Outcome: Progressing   Problem: Health Behavior/Discharge Planning: Goal: Ability to manage health-related needs will improve Outcome: Progressing   Problem: Clinical Measurements: Goal: Ability to maintain clinical measurements within normal limits will improve Outcome: Progressing Goal: Will remain free from infection Outcome: Progressing Goal: Diagnostic test results will improve Outcome: Progressing Goal: Respiratory complications will improve Outcome: Progressing Goal: Cardiovascular complication will be avoided Outcome: Progressing   Problem: Activity: Goal: Risk for activity intolerance will decrease Outcome: Progressing   Problem: Nutrition: Goal: Adequate nutrition will be maintained Outcome: Progressing   Problem: Coping: Goal: Level of anxiety will decrease Outcome: Progressing   Problem: Elimination: Goal: Will not experience complications related to bowel motility Outcome: Progressing Goal: Will not experience complications related to urinary retention Outcome: Progressing   Problem: Pain Managment: Goal: General experience of comfort will improve and/or be controlled Outcome: Progressing   Problem: Safety: Goal: Ability to remain free from injury will improve Outcome: Progressing   Problem: Education: Goal: Ability to demonstrate management of disease process will improve Outcome: Progressing Goal: Ability to verbalize understanding of medication therapies will improve Outcome: Progressing Goal: Individualized Educational Video(s) Outcome: Progressing   Problem: Activity: Goal: Capacity to carry out activities will improve Outcome: Progressing   Problem: Cardiac: Goal: Ability to achieve and maintain adequate cardiopulmonary perfusion will  improve Outcome: Progressing   Problem: Safety: Goal: Non-violent Restraint(s) Outcome: Progressing   Problem: Skin Integrity: Goal: Risk for impaired skin integrity will decrease Outcome: Not Progressing Note: Inter- dri placed under pannus as skin looks fragile, moisture noted.    "

## 2024-02-28 NOTE — Plan of Care (Signed)
   Problem: Education: Goal: Knowledge of General Education information will improve Description: Including pain rating scale, medication(s)/side effects and non-pharmacologic comfort measures Outcome: Progressing   Problem: Clinical Measurements: Goal: Will remain free from infection Outcome: Progressing

## 2024-02-28 NOTE — Progress Notes (Signed)
 Occupational Therapy Treatment Patient Details Name: Stephanie Knight MRN: [id number] DOB: 1960/06/12 Today's Date: 02/28/2024   History of present illness Pt is a 64 y.o female presented to [facility] ED 1/10 for BLE edema. Found to be hypoxic and transferred to [facility]. Workup for potential CHF. CT findings consistent with RV dysfunction. Elevated CO2 levels requiring BiPAP, however, becoming agitated with mask and poor tolerance started on dex for compliance. UTI. PMH: COPD, tobacco abuse   OT comments  Pt. Seen for skilled OT treatment session.  Pt. Able to complete bed mobility in/out with MINA for trunk and BLES.  LB dressing with set up seated with MAX cues for PLB and initiation of rest breaks for o2 management.  Multiple sit/stands from elevated surface. Pt. With heavy reliance of BUEs pulling on walker with rocking momentum to achieve standing.  3 steps towards hob with CGA.  Pt. Declines up to chair this am wanting to nap first but agreeable to oob later today.  Will benefit from cont. EC education and implementation during mobility with ADLs with heavy focus on rest breaks  and PLB.    O2 [place] 6L-hoovering 87-89% with rest breaks and PLB, able to get to 90/91% briefly but mostly at high 80s throughout session.        If plan is discharge home, recommend the following:  A little help with walking and/or transfers;A little help with bathing/dressing/bathroom;Assistance with cooking/housework;Assist for transportation;Help with stairs or ramp for entrance   Equipment Recommendations  Other (comment)    Recommendations for Other Services      Precautions / Restrictions Precautions Precautions: Fall Precaution/Restrictions Comments: watch BP & O2       Mobility Bed Mobility Overal bed mobility: Needs Assistance Bed Mobility: Rolling, Sidelying to Sit, Sit to Sidelying Rolling: Supervision Sidelying to sit: Min assist     Sit to sidelying: Min assist General bed mobility  comments: for trunk to mid line and bles into bed at end of session    Transfers Overall transfer level: Needs assistance Equipment used: Rolling walker (2 wheels) Transfers: Sit to/from Stand Sit to Stand: Min assist, Contact guard assist, From elevated surface           General transfer comment: pt. initiates rocking motion for momentum to transition into standing.  rec. one hand on bed to push, pt. only able to achieve standing w/o assitance with BUEs on rw. stood to initiate pivot to recliner but states she wasnt planning on sitting up and had planned to take a nap first.  assisted into sitting for seated rest break, then sits/stand with 3 side steps to hob in prep for back to bed-max cues for plb and o2 management     Balance                                           ADL either performed or assessed with clinical judgement   ADL Overall ADL's : Needs assistance/impaired                     Lower Body Dressing: Set up;Sitting/lateral leans Lower Body Dressing Details (indicate cue type and reason): able to complete a form of figure four to access BLE for donning socks-max cues for PLB and rest breaks due to dip in o2 levels during physical activity   Toilet Transfer Details (indicate cue type and  reason): reviewed benefits of use of bsc vs pure wik. pt. states she wants to keep pure wik in for back up but will call for assistance to bsc also -sit/stand x2 with side steps pt. pt. declined use of bsc or transfer to recliner         Functional mobility during ADLs: Minimal assistance General ADL Comments: assist for safety, actvity tolerance, balance    Extremity/Trunk Assessment              Vision       Perception     Praxis     Communication Communication Communication: No apparent difficulties   Cognition Arousal: Alert Behavior During Therapy: WFL for tasks assessed/performed Cognition: No apparent impairments              OT - Cognition Comments: would benefit from a hgih level assessment                 Following commands: Intact        Cueing   Cueing Techniques: Verbal cues  Exercises      Shoulder Instructions       General Comments       Pertinent Vitals/ Pain       Pain Assessment Pain Assessment: No/denies pain  Home Living                                          Prior Functioning/Environment              Frequency  Min 2X/week        Progress Toward Goals  OT Goals(current goals can now be found in the care plan section)  Progress towards OT goals: Progressing toward goals     Plan      Co-evaluation                 AM-PAC OT 6 Clicks Daily Activity     Outcome Measure   Help from another person eating meals?: None Help from another person taking care of personal grooming?: A Little Help from another person toileting, which includes using toliet, bedpan, or urinal?: A Lot Help from another person bathing (including washing, rinsing, drying)?: A Lot Help from another person to put on and taking off regular upper body clothing?: A Little Help from another person to put on and taking off regular lower body clothing?: A Lot 6 Click Score: 16    End of Session Equipment Utilized During Treatment: Rolling walker (2 wheels);Oxygen  OT Visit Diagnosis: Unsteadiness on feet (R26.81);Other abnormalities of gait and mobility (R26.89);Muscle weakness (generalized) (M62.81)   Activity Tolerance Patient tolerated treatment well   Patient Left with call bell/phone within reach;with bed alarm set;in bed;Other (comment) (pt. gave permission for all 4 rails up due to wanting to be in r sidelying for safety)   Nurse Communication Other (comment) (rn states ok to work with pt.)        Time: 9094-9075 OT Time Calculation (min): 19 min  Charges: OT General Charges $OT Visit: 1 Visit OT Treatments $Self Care/Home Management : 8-22  mins  Randall, COTA/L Acute Rehabilitation [phone]   CHRISTELLA Nest Lorraine-COTA/L  02/28/2024, 9:50 AM

## 2024-02-28 NOTE — Evaluation (Signed)
 RT Evaluate and Treat Note  02/28/2024   Breathing is (select one): Same as normal    The following was found on auscultation (select multiple):  Bilateral Breath Sounds: Diminished (02/28/24 0759)  R Upper  Breath Sounds: Diminished (02/27/24 2000) L Upper Breath Sounds: Diminished (02/27/24 2000) R Lower Breath Sounds: Diminished (02/27/24 2000) L Lower Breath Sounds: Diminished (02/27/24 2000)    Cough Assessment: Cough: Congested;Productive (02/27/24 2200)    Most Recent Chest Xray:... (No results found.    The following medications and/or interventions were ordered/changed/discontinued as part of the Respiratory Treatment protocol:   Medication Changes: No Change   Airway Clearance Changes: No Change   Oxygen Therapy Changes:No Change

## 2024-02-28 NOTE — Progress Notes (Addendum)
 "  Stephanie Knight  FMW:[id number] DOB: 06-Nov-1960 DOA: 02/22/2024 PCP: [facility]    Brief Narrative:  64 year old with a history of severe COPD (recently prescribed home oxygen), tobacco abuse, and chronic intermittent rectal bleeding who presented to [facility] 02/22/2024 with complaints of shortness of breath and severe bilateral lower extremity edema.  She was found to be in respiratory distress at presentation and required BiPAP for correction of her hypercapnia.  At admission she was found to have an elevated BNP at 1948 and she was polycythemic.  She CTa chest was negative for PE but did reveal significant right heart enlargement.  TTE noted preserved EF but with right ventricular enlargement and septal flattening.   Significant Events: 1/10 admit by TR[facility] from [facility]  1/11 transfer to ICU for precedex  gtt for BiPAP compliance  1/11 TTE EF 60-65% -normal RV systolic function but ventricular septum flattened consistent with elevated RV pressure/volume overload -moderately increased RV size and wall thickness 1/16 TRH assumed care   Goals of Care:   Code Status: Full Code   DVT prophylaxis: heparin  injection 5,000 Units Start: 02/22/24 2200   Interim Hx: No acute events reported overnight.  Afebrile.  Vital signs stable.  Oxygen saturation 87-95% on 6 L nasal cannula support.  She is resting comfortably in bed at the time of my visit.  She states her shortness of breath is much improved but not yet back to her baseline.  She denies chest pain nausea vomiting or abdominal pain.  Assessment & Plan:  Acute on chronic cor pulmonale Due to unmanaged OHS/obesity as well as volume overload - resume lasix  dosing -net -13.5 L since admission  Filed Weights   02/26/24 0500 02/27/24 2301 02/28/24 0300  Weight: 127.9 kg 127.5 kg 127.5 kg     Acute hypoxic and hypercapnic respiratory failure due to above BiPAP dependent -to continue BiPAP every night and  during scheduled daytime naps -continues to require 6 L nasal cannula support both at rest and with ambulation  Acute delirium -metabolic encephalopathy due to hypercapnia Required Precedex  while in ICU -transitioned to valproic  acid and Zyprexa  -stable at present  COPD on home O2 No acute exacerbation at present -continue scheduled medical therapies  OHS Counseled on absolute need to comply with CPAP/BiPAP use every single night  Chronic diastolic/right-sided heart failure Continue diuresis as noted above  Morbid obesity - Body mass index is 44.02 kg/m.   Family Communication: No family present at time of exam Disposition:   Home Health Pt1/15/2026 1524  Objective: Blood pressure (!) 126/59, pulse 98, temperature 98.7 F (37.1 C), temperature source Oral, resp. rate 13, height 5' 7 (1.702 m), weight 127.5 kg, SpO2 94%.  Intake/Output Summary (Last 24 hours) at 02/28/2024 0810 Last data filed at 02/28/2024 0300 Gross per 24 hour  Intake 734.94 ml  Output 795 ml  Net -60.06 ml   Filed Weights   02/26/24 0500 02/27/24 2301 02/28/24 0300  Weight: 127.9 kg 127.5 kg 127.5 kg    Examination: General: No acute respiratory distress Lungs: Faint bibasilar crackles with no wheezing with distant breath sounds due to body habitus Cardiovascular: Distant heart sounds due to body habitus -bigeminy appreciable on exam Abdomen: Morbidly obese, soft, bowel sounds positive, no rebound Extremities: Chronic appearing 2+ bilateral lower extremity edema  CBC: Recent Labs  Lab 02/22/24 1357 02/23/24 0135 02/24/24 1041 02/25/24 0902 02/25/24 2203 02/28/24 0409  WBC 8.5 8.3  --   --   --  7.1  NEUTROABS  --  5.5  --   --   --  5.5  HGB 14.6 14.1   < > 17.3* 17.0* 13.9  HCT 48.9* 48.7*   < > 51.0* 50.0* 48.5*  MCV 80.4 82.8  --   --   --  83.3  PLT 329 297  --   --   --  227   < > = values in this interval not displayed.   Basic Metabolic Panel: Recent Labs  Lab 02/23/24 0135  02/24/24 1041 02/25/24 0419 02/25/24 0902 02/26/24 0933 02/27/24 0235 02/28/24 0605  NA 140   < > 146*   < > 145 142 143  K 4.8   < > 4.5   < > 4.2 4.0 3.7  CL 99  --  97*  --  98 97* 101  CO2 34*  --  41*  --  39* 37* 37*  GLUCOSE 97  --  122*  --  109* 77 105*  BUN 19  --  24*  --  31* 27* 22  CREATININE 0.95  --  1.05*  --  1.06* 0.95 0.80  CALCIUM 8.3*  --  9.0  --  8.8* 8.7* 8.5*  MG 1.7  --  1.8  --  1.9 1.9  --   PHOS 4.8*  --  5.0*  --   --   --   --    < > = values in this interval not displayed.   GFR: Estimated Creatinine Clearance: 96.1 mL/min (by C-G formula based on SCr of 0.8 mg/dL).   Scheduled Meds:  arformoterol   15 mcg Nebulization BID   budesonide  (PULMICORT ) nebulizer solution  0.25 mg Nebulization BID   Chlorhexidine  Gluconate Cloth  6 each Topical Daily   divalproex   500 mg Oral Q12H   doxycycline   100 mg Oral Q12H   heparin   5,000 Units Subcutaneous Q8H   methylPREDNISolone  (SOLU-MEDROL ) injection  40 mg Intravenous Daily   nicotine   14 mg Transdermal Daily   OLANZapine   5 mg Oral QID   mouth rinse  15 mL Mouth Rinse 4 times per day   polyethylene glycol  17 g Oral BID   revefenacin   175 mcg Nebulization Daily   senna-docusate  1 tablet Oral BID   sodium chloride  flush  3 mL Intravenous Q12H   sterile water  (preservative free)       Continuous Infusions:  cefTRIAXone  (ROCEPHIN )  IV Stopped (02/27/24 1131)     LOS: 6 days   Reyes IVAR Moores, MD [facility] Office  [phone] Pager - Text Page per Tracey  If 7PM-7AM, please contact night-coverage per Amion 02/28/2024, 8:10 AM     "

## 2024-02-29 MED ORDER — DIVALPROEX SODIUM 250 MG PO DR TAB
250.0000 mg | DELAYED_RELEASE_TABLET | Freq: Two times a day (BID) | ORAL | Status: AC
  Administered 2024-02-29 – 2024-03-01 (×4): 250 mg via ORAL
  Filled 2024-02-29 (×4): qty 1

## 2024-02-29 MED ORDER — OLANZAPINE 2.5 MG PO TABS
2.5000 mg | ORAL_TABLET | Freq: Four times a day (QID) | ORAL | Status: DC
  Administered 2024-02-29 (×2): 2.5 mg via ORAL
  Filled 2024-02-29 (×6): qty 1

## 2024-02-29 NOTE — Plan of Care (Signed)
 " Problem: Education: Goal: Knowledge of General Education information will improve Description: Including pain rating scale, medication(s)/side effects and non-pharmacologic comfort measures 02/29/2024 1536 by Tad Delon SAUNDERS, RN Outcome: Progressing 02/29/2024 1535 by Tad Delon SAUNDERS, RN Outcome: Progressing   Problem: Clinical Measurements: Goal: Ability to maintain clinical measurements within normal limits will improve 02/29/2024 1536 by Tad Delon SAUNDERS, RN Outcome: Progressing 02/29/2024 1535 by Tad Delon SAUNDERS, RN Outcome: Progressing Goal: Will remain free from infection 02/29/2024 1536 by Tad Delon SAUNDERS, RN Outcome: Progressing 02/29/2024 1535 by Tad Delon SAUNDERS, RN Outcome: Progressing Goal: Diagnostic test results will improve 02/29/2024 1536 by Tad Delon SAUNDERS, RN Outcome: Progressing 02/29/2024 1535 by Tad Delon SAUNDERS, RN Outcome: Progressing Goal: Respiratory complications will improve 02/29/2024 1536 by Tad Delon SAUNDERS, RN Outcome: Progressing 02/29/2024 1535 by Tad Delon SAUNDERS, RN Outcome: Progressing Goal: Cardiovascular complication will be avoided 02/29/2024 1536 by Tad Delon SAUNDERS, RN Outcome: Progressing 02/29/2024 1535 by Tad Delon SAUNDERS, RN Outcome: Progressing   Problem: Activity: Goal: Risk for activity intolerance will decrease 02/29/2024 1536 by Tad Delon SAUNDERS, RN Outcome: Progressing 02/29/2024 1535 by Tad Delon SAUNDERS, RN Outcome: Progressing   Problem: Nutrition: Goal: Adequate nutrition will be maintained 02/29/2024 1536 by Tad Delon SAUNDERS, RN Outcome: Progressing 02/29/2024 1535 by Tad Delon SAUNDERS, RN Outcome: Progressing   Problem: Coping: Goal: Level of anxiety will decrease 02/29/2024 1536 by Tad Delon SAUNDERS, RN Outcome: Progressing 02/29/2024 1535 by Tad Delon SAUNDERS, RN Outcome: Progressing   Problem: Elimination: Goal: Will not experience complications related to bowel  motility 02/29/2024 1536 by Tad Delon SAUNDERS, RN Outcome: Progressing 02/29/2024 1535 by Tad Delon SAUNDERS, RN Outcome: Progressing Goal: Will not experience complications related to urinary retention 02/29/2024 1536 by Tad Delon SAUNDERS, RN Outcome: Progressing 02/29/2024 1535 by Tad Delon SAUNDERS, RN Outcome: Progressing   Problem: Pain Managment: Goal: General experience of comfort will improve and/or be controlled 02/29/2024 1536 by Tad Delon SAUNDERS, RN Outcome: Progressing 02/29/2024 1535 by Tad Delon SAUNDERS, RN Outcome: Progressing   Problem: Safety: Goal: Ability to remain free from injury will improve 02/29/2024 1536 by Tad Delon SAUNDERS, RN Outcome: Progressing 02/29/2024 1535 by Tad Delon SAUNDERS, RN Outcome: Progressing   Problem: Skin Integrity: Goal: Risk for impaired skin integrity will decrease 02/29/2024 1536 by Tad Delon SAUNDERS, RN Outcome: Progressing 02/29/2024 1535 by Tad Delon SAUNDERS, RN Outcome: Progressing   Problem: Education: Goal: Ability to demonstrate management of disease process will improve 02/29/2024 1536 by Tad Delon SAUNDERS, RN Outcome: Progressing 02/29/2024 1535 by Tad Delon SAUNDERS, RN Outcome: Progressing Goal: Ability to verbalize understanding of medication therapies will improve 02/29/2024 1536 by Tad Delon SAUNDERS, RN Outcome: Progressing 02/29/2024 1535 by Tad Delon SAUNDERS, RN Outcome: Progressing Goal: Individualized Educational Video(s) 02/29/2024 1536 by Tad Delon SAUNDERS, RN Outcome: Progressing 02/29/2024 1535 by Tad Delon SAUNDERS, RN Outcome: Progressing   Problem: Activity: Goal: Capacity to carry out activities will improve 02/29/2024 1536 by Tad Delon SAUNDERS, RN Outcome: Progressing 02/29/2024 1535 by Tad Delon SAUNDERS, RN Outcome: Progressing   Problem: Cardiac: Goal: Ability to achieve and maintain adequate cardiopulmonary perfusion will improve 02/29/2024 1536 by Tad Delon SAUNDERS, RN Outcome:  Progressing 02/29/2024 1535 by Tad Delon SAUNDERS, RN Outcome: Progressing   Problem: Health Behavior/Discharge Planning: Goal: Ability to manage health-related needs will improve 02/29/2024 1536 by Tad Delon SAUNDERS, RN Outcome: Not Progressing 02/29/2024 1535 by Tad Delon SAUNDERS, RN Outcome: Not Progressing   Problem: Safety: Goal: Non-violent Restraint(s) 02/29/2024 1536 by Tad Delon  R, RN Outcome: Not Applicable 02/29/2024 1535 by Tad Delon SAUNDERS, RN Outcome: Progressing   "

## 2024-02-29 NOTE — Progress Notes (Signed)
 "  Stephanie Knight  FMW:[id number] DOB: 1960/11/03 DOA: 02/22/2024 PCP: [facility]    Brief Narrative:  64 year old with a history of severe COPD (recently prescribed home oxygen), tobacco abuse, and intermittent rectal bleeding who presented to [facility] 02/22/2024 with complaints of shortness of breath and severe bilateral lower extremity edema.  She was found to be in respiratory distress at presentation and required BiPAP for correction of hypercapnia.  At admission she was found to have an elevated BNP at 1948 and she was polycythemic.  CTa chest was negative for PE but did reveal significant right heart enlargement.  TTE noted preserved EF but with right ventricular enlargement and septal flattening.   Significant Events: 1/10 admit by TR[facility] from [facility]  1/11 transfer to ICU for precedex  gtt for BiPAP compliance  1/11 TTE EF 60-65% -normal RV systolic function but ventricular septum flattened consistent with elevated RV pressure/volume overload -moderately increased RV size and wall thickness 1/16 TRH assumed care   Goals of Care:   Code Status: Full Code   DVT prophylaxis: heparin  injection 5,000 Units Start: 02/22/24 2200   Interim Hx: No acute events reported overnight.  Afebrile.  Vital signs stable.  Oxygen saturation 98% on 5 L nasal cannula.  No new complaints at time of visit today.  Denies chest pain nausea vomiting abdominal pain.  Assessment & Plan:  Acute on chronic cor pulmonale Due to unmanaged OHS/obesity as well as volume overload - resume lasix  dosing - net -16 L since admission  Acute hypoxic and hypercapnic respiratory failure due to above BiPAP dependent -to continue BiPAP every night and during scheduled daytime naps -continues to require 5-6 L nasal cannula support both at rest and with ambulation  Acute delirium -metabolic encephalopathy due to hypercapnia Required Precedex  while in ICU -transitioned to valproic  acid and  Zyprexa  -stable at present - attempt to wean off zyprexa  and valproic  acid   COPD on home O2 No acute exacerbation at present -continue scheduled medical therapies  OHS Counseled on absolute need to comply with CPAP/BiPAP use every single night  Chronic diastolic/right-sided heart failure Continue diuresis as noted above  Morbid obesity - Body mass index is 43.99 kg/m.   Family Communication: No family present at time of exam Disposition:   Home Health Pt1/15/2026 1524  Objective: Blood pressure 126/64, pulse 81, temperature 99.8 F (37.7 C), temperature source Oral, resp. rate 20, height 5' 7 (1.702 m), weight 127.4 kg, SpO2 93%.  Intake/Output Summary (Last 24 hours) at 02/29/2024 0747 Last data filed at 02/29/2024 0100 Gross per 24 hour  Intake 360 ml  Output 3400 ml  Net -3040 ml   Filed Weights   02/27/24 2301 02/28/24 0300 02/29/24 0300  Weight: 127.5 kg 127.5 kg 127.4 kg    Examination: General: No acute respiratory distress Lungs: Faint bibasilar crackles with no wheezing - distant breath sounds due to body habitus Cardiovascular: Distant heart sounds due to body habitus - RRR today Abdomen: Morbidly obese, soft, bowel sounds positive, no rebound Extremities: Chronic appearing 2+ bilateral lower extremity edema  CBC: Recent Labs  Lab 02/22/24 1357 02/23/24 0135 02/24/24 1041 02/25/24 0902 02/25/24 2203 02/28/24 0409  WBC 8.5 8.3  --   --   --  7.1  NEUTROABS  --  5.5  --   --   --  5.5  HGB 14.6 14.1   < > 17.3* 17.0* 13.9  HCT 48.9* 48.7*   < > 51.0* 50.0* 48.5*  MCV  80.4 82.8  --   --   --  83.3  PLT 329 297  --   --   --  227   < > = values in this interval not displayed.   Basic Metabolic Panel: Recent Labs  Lab 02/23/24 0135 02/24/24 1041 02/25/24 0419 02/25/24 0902 02/26/24 0933 02/27/24 0235 02/28/24 0605  NA 140   < > 146*   < > 145 142 143  K 4.8   < > 4.5   < > 4.2 4.0 3.7  CL 99  --  97*  --  98 97* 101  CO2 34*  --  41*  --   39* 37* 37*  GLUCOSE 97  --  122*  --  109* 77 105*  BUN 19  --  24*  --  31* 27* 22  CREATININE 0.95  --  1.05*  --  1.06* 0.95 0.80  CALCIUM 8.3*  --  9.0  --  8.8* 8.7* 8.5*  MG 1.7  --  1.8  --  1.9 1.9  --   PHOS 4.8*  --  5.0*  --   --   --   --    < > = values in this interval not displayed.   GFR: Estimated Creatinine Clearance: 96 mL/min (by C-G formula based on SCr of 0.8 mg/dL).   Scheduled Meds:  arformoterol   15 mcg Nebulization BID   budesonide  (PULMICORT ) nebulizer solution  0.25 mg Nebulization BID   carvedilol   3.125 mg Oral BID WC   Chlorhexidine  Gluconate Cloth  6 each Topical Daily   divalproex   500 mg Oral Q12H   furosemide   40 mg Intravenous Daily   heparin   5,000 Units Subcutaneous Q8H   nicotine   14 mg Transdermal Daily   OLANZapine   5 mg Oral QID   mouth rinse  15 mL Mouth Rinse 4 times per day   polyethylene glycol  17 g Oral BID   revefenacin   175 mcg Nebulization Daily   senna-docusate  1 tablet Oral BID   sodium chloride  flush  3 mL Intravenous Q12H     LOS: 7 days   Reyes IVAR Moores, MD [facility] Office  [phone] Pager - Text Page per Tracey  If 7PM-7AM, please contact night-coverage per Amion 02/29/2024, 7:47 AM     "

## 2024-02-29 NOTE — Progress Notes (Addendum)
 Notified Dr Johnita Hails that patient had 3 min Bigeminy at 1255. Patient asymptomatic.

## 2024-02-29 NOTE — Evaluation (Signed)
 RT Evaluate and Treat Note  02/29/2024   Breathing is (select one): Worse than normal   The following was found on auscultation (select multiple):  Bilateral Breath Sounds: Diminished;Expiratory wheezes (02/29/24 0757)  R Upper  Breath Sounds: Expiratory wheezes;Diminished (02/29/24 0754) L Upper Breath Sounds: Diminished (02/29/24 0754) R Lower Breath Sounds: Diminished (02/29/24 0754) L Lower Breath Sounds: Diminished (02/29/24 0754)    Cough Assessment: Cough: Congested;Productive (02/29/24 0708)    Most Recent Chest Xray:... Per CXR 02/25/24 Minimal bilateral pulmonary edema may be present.    The following medications and/or interventions were ordered/changed/discontinued as part of the Respiratory Treatment protocol:   Medication Changes: No Change-  Patient is getting brovana , pulmicort , and yupelri . Patient gets a duoneb as needed for wheezing and SOB.   Airway Clearance Changes: Cough and deep breathing.    Oxygen Therapy Changes:BiPAPOrdered            and Nasal Ordered   BiPAP as needed for SOB. Nasal cannula weaned from 6L to 5L.

## 2024-02-29 NOTE — Plan of Care (Signed)
   Problem: Clinical Measurements: Goal: Ability to maintain clinical measurements within normal limits will improve Outcome: Progressing

## 2024-03-01 MED ORDER — DIVALPROEX SODIUM 125 MG PO DR TAB
125.0000 mg | DELAYED_RELEASE_TABLET | Freq: Two times a day (BID) | ORAL | Status: DC
  Administered 2024-03-02: 125 mg via ORAL
  Filled 2024-03-01 (×2): qty 1

## 2024-03-01 NOTE — Plan of Care (Signed)

## 2024-03-01 NOTE — Evaluation (Signed)
 RT Evaluate and Treat Note  03/01/2024   Breathing is (select one): Same as normal    The following was found on auscultation (select multiple):  Bilateral Breath Sounds: Diminished (03/01/24 0827)  R Upper  Breath Sounds: Expiratory wheezes;Diminished (02/29/24 0754) L Upper Breath Sounds: Diminished (02/29/24 0754) R Lower Breath Sounds: Diminished (02/29/24 0754) L Lower Breath Sounds: Diminished (02/29/24 0754)    Cough Assessment: Cough: Productive (03/01/24 0100)    Most Recent Chest Xray:... (No results found.Stable cardiomegaly with mild central pulmonary vascular congestion. Minimal bilateral pulmonary edema may be present.   The following medications and/or interventions were ordered/changed/discontinued as part of the Respiratory Treatment protocol:   Medication Changes: None   Airway Clearance Changes: None   Oxygen Therapy Changes:No Change

## 2024-03-01 NOTE — Progress Notes (Signed)
 "  Stephanie Knight  FMW:[id number] DOB: March 25, 1960 DOA: 02/22/2024 PCP: [facility]    Brief Narrative:  64 year old with a history of severe COPD (recently prescribed home oxygen), tobacco abuse, and intermittent rectal bleeding who presented to [facility] 02/22/2024 with complaints of shortness of breath and severe bilateral lower extremity edema.  She was found to be in respiratory distress at presentation and required BiPAP for correction of hypercapnia.  At admission she was found to have an elevated BNP at 1948 and she was polycythemic.  CTa chest was negative for PE but did reveal significant right heart enlargement.  TTE noted preserved EF but with right ventricular enlargement and septal flattening.   Significant Events: 1/10 admit by TRH from [facility]  1/11 transfer to ICU for precedex  gtt for BiPAP compliance  1/11 TTE EF 60-65% -normal RV systolic function but ventricular septum flattened consistent with elevated RV pressure/volume overload -moderately increased RV size and wall thickness 1/16 TRH assumed care   Goals of Care:   Code Status: Full Code   DVT prophylaxis: heparin  injection 5,000 Units Start: 02/22/24 2200   Interim Hx: No acute events recorded overnight.  Afebrile.  Vital signs stable.  States she feels better overall.  Feels that her shortness of breath has improved significantly.  Continues to require 5-6 L nasal cannula support at rest.  No chest pain nausea vomiting or abdominal pain.  Assessment & Plan:  Acute on chronic cor pulmonale Due to unmanaged OHS/obesity as well as volume overload -continue lasix  dosing - net -18.5 L since admission  Acute hypoxic and hypercapnic respiratory failure due to above BiPAP dependent -to continue BiPAP every night and during scheduled daytime naps -continues to require high level nasal cannula support both at rest and with ambulation  Acute delirium -metabolic encephalopathy due to  hypercapnia Required Precedex  while in ICU - transitioned to valproic  acid and Zyprexa  - stable at present -discontinue Zyprexa  and wean off valproic  acid  COPD on home O2 No acute exacerbation at present - continue usual scheduled medical therapies  OHS Counseled on absolute need to comply with CPAP/BiPAP use every single night  Chronic diastolic/right-sided heart failure Continue diuresis as noted above  Morbid obesity - Body mass index is 44.82 kg/m.   Family Communication: No family present at time of exam Disposition:   Home Health Pt1/15/2026 1524 -probable discharge home 1/19 if home O2 can be arranged  Objective: Blood pressure 138/60, pulse 80, temperature 98 F (36.7 C), temperature source Oral, resp. rate (!) 22, height 5' 7 (1.702 m), weight 129.8 kg, SpO2 97%.  Intake/Output Summary (Last 24 hours) at 03/01/2024 1527 Last data filed at 03/01/2024 1300 Gross per 24 hour  Intake 240 ml  Output 2501 ml  Net -2261 ml   Filed Weights   02/28/24 0300 02/29/24 0300 03/01/24 0348  Weight: 127.5 kg 127.4 kg 129.8 kg    Examination: General: No acute respiratory distress Lungs: Faint bibasilar crackles with no wheezing - distant breath sounds due to body habitus Cardiovascular: Distant heart sounds due to body habitus - RRR without murmur Abdomen: Morbidly obese, soft, bowel sounds positive, no rebound Extremities: Chronic appearing 2+ bilateral lower extremity edema that is slowly improving  CBC: Recent Labs  Lab 02/25/24 0902 02/25/24 2203 02/28/24 0409  WBC  --   --  7.1  NEUTROABS  --   --  5.5  HGB 17.3* 17.0* 13.9  HCT 51.0* 50.0* 48.5*  MCV  --   --  83.3  PLT  --   --  227   Basic Metabolic Panel: Recent Labs  Lab 02/25/24 0419 02/25/24 0902 02/26/24 0933 02/27/24 0235 02/28/24 0605  NA 146*   < > 145 142 143  K 4.5   < > 4.2 4.0 3.7  CL 97*  --  98 97* 101  CO2 41*  --  39* 37* 37*  GLUCOSE 122*  --  109* 77 105*  BUN 24*  --  31* 27* 22   CREATININE 1.05*  --  1.06* 0.95 0.80  CALCIUM 9.0  --  8.8* 8.7* 8.5*  MG 1.8  --  1.9 1.9  --   PHOS 5.0*  --   --   --   --    < > = values in this interval not displayed.   GFR: Estimated Creatinine Clearance: 97.1 mL/min (by C-G formula based on SCr of 0.8 mg/dL).   Scheduled Meds:  arformoterol   15 mcg Nebulization BID   budesonide  (PULMICORT ) nebulizer solution  0.25 mg Nebulization BID   carvedilol   3.125 mg Oral BID WC   Chlorhexidine  Gluconate Cloth  6 each Topical Daily   divalproex   250 mg Oral Q12H   furosemide   40 mg Intravenous Daily   heparin   5,000 Units Subcutaneous Q8H   nicotine   14 mg Transdermal Daily   mouth rinse  15 mL Mouth Rinse 4 times per day   polyethylene glycol  17 g Oral BID   revefenacin   175 mcg Nebulization Daily   senna-docusate  1 tablet Oral BID   sodium chloride  flush  3 mL Intravenous Q12H     LOS: 8 days   Reyes IVAR Moores, MD [facility] Office  [phone] Pager - Text Page per Tracey  If 7PM-7AM, please contact night-coverage per Amion 03/01/2024, 3:27 PM     "

## 2024-03-02 ENCOUNTER — Other Ambulatory Visit (HOSPITAL_COMMUNITY): Payer: Self-pay

## 2024-03-02 LAB — BASIC METABOLIC PANEL WITH GFR
Anion gap: 5 (ref 5–15)
BUN: 11 mg/dL (ref 8–23)
CO2: 40 mmol/L — ABNORMAL HIGH (ref 22–32)
Calcium: 8.4 mg/dL — ABNORMAL LOW (ref 8.9–10.3)
Chloride: 96 mmol/L — ABNORMAL LOW (ref 98–111)
Creatinine, Ser: 0.61 mg/dL (ref 0.44–1.00)
GFR, Estimated: >60 mL/min
Glucose, Bld: 126 mg/dL — ABNORMAL HIGH (ref 70–99)
Potassium: 3.9 mmol/L (ref 3.5–5.1)
Sodium: 141 mmol/L (ref 135–145)

## 2024-03-02 MED ORDER — BUDESON-GLYCOPYRROL-FORMOTEROL 160-9-4.8 MCG/ACT IN AERO
2.0000 | INHALATION_SPRAY | Freq: Two times a day (BID) | RESPIRATORY_TRACT | Status: DC
  Administered 2024-03-02: 2 via RESPIRATORY_TRACT
  Filled 2024-03-02: qty 5.9

## 2024-03-02 MED ORDER — BUDESON-GLYCOPYRROL-FORMOTEROL 160-9-4.8 MCG/ACT IN AERO
2.0000 | INHALATION_SPRAY | Freq: Two times a day (BID) | RESPIRATORY_TRACT | 3 refills | Status: AC
  Filled 2024-03-02: qty 10.7, 30d supply, fill #0

## 2024-03-02 MED ORDER — POLYETHYLENE GLYCOL 3350 17 GM/SCOOP PO POWD
17.0000 g | Freq: Two times a day (BID) | ORAL | 0 refills | Status: AC
  Filled 2024-03-02: qty 476, 14d supply, fill #0

## 2024-03-02 MED ORDER — FUROSEMIDE 40 MG PO TABS
40.0000 mg | ORAL_TABLET | Freq: Every day | ORAL | 0 refills | Status: AC
  Filled 2024-03-02: qty 30, 30d supply, fill #0

## 2024-03-02 MED ORDER — SENNOSIDES-DOCUSATE SODIUM 8.6-50 MG PO TABS: 1.0000 | ORAL_TABLET | Freq: Two times a day (BID) | ORAL | Status: AC

## 2024-03-02 MED ORDER — ACETAMINOPHEN 325 MG PO TABS: 650.0000 mg | ORAL_TABLET | Freq: Four times a day (QID) | ORAL | Status: AC | PRN

## 2024-03-02 MED ORDER — FUROSEMIDE 40 MG PO TABS: 40.0000 mg | ORAL_TABLET | Freq: Every day | ORAL | Status: DC

## 2024-03-02 MED ORDER — CARVEDILOL 3.125 MG PO TABS
3.1250 mg | ORAL_TABLET | Freq: Two times a day (BID) | ORAL | 2 refills | Status: AC
  Filled 2024-03-02: qty 60, 30d supply, fill #0

## 2024-03-02 MED ORDER — ALBUTEROL SULFATE HFA 108 (90 BASE) MCG/ACT IN AERS
2.0000 | INHALATION_SPRAY | Freq: Four times a day (QID) | RESPIRATORY_TRACT | 3 refills | Status: AC | PRN
  Filled 2024-03-02: qty 6.7, 25d supply, fill #0

## 2024-03-02 NOTE — TOC Progression Note (Addendum)
 Transition of Care [facility]) - Progression Note    Patient Details  Name: Stephanie Knight MRN: [id number] Date of Birth: 1960/08/08  [facility] CM/SW Contact  Waddell Barnie Rama, RN Phone Number: 03/02/2024, 11:13 AM  Clinical Narrative:    Patient will need home oxygen,  [employer] to supply, NCM checking with Financial counseling to make sure she has Medicare Part B, it is not showing on paperwork per [employer].  If she does not have Part B she will have to pay out of pocket. Per Rojelio with Financial counseling she is only eligible for Medicare Part A. NCM spoke with patient regarding this, she states she has the Medicare papers to fax in but she left her Medicare number at home.  She will fax it in when she gets home.  NCM informed her that [employer] will call her to work something out with her regarding her DME.                     Expected Discharge Plan and Services                                               Social Drivers of Health (SDOH) Interventions SDOH Screenings   Food Insecurity: No Food Insecurity (02/22/2024)  Housing: Low Risk (07/15/2023)   Received from [facility]  Transportation Needs: No Transportation Needs (07/15/2023)   Received from [facility]  Utilities: Low Risk (07/15/2023)   Received from [facility]  Financial Resource Strain: Low Risk  (01/16/2023)   Received from [facility]  Tobacco Use: High Risk (02/25/2024)    Readmission Risk Interventions    02/28/2024   12:31 PM 02/24/2024    1:06 PM  Readmission Risk Prevention Plan  Post Dischage Appt  Complete  Medication Screening Complete Complete  Transportation Screening Complete Complete

## 2024-03-02 NOTE — Progress Notes (Signed)
 SATURATION QUALIFICATIONS:   Patient Saturations on Room Air at Rest = unable to complete as was at 5L of o2 via [place] at 86% at rest.  Patient Saturations on Room Air while Ambulating = unable to complete on RA  Patient Saturations on 6 Liters of oxygen while Ambulating = 87% but then with seated rest break was at 92%  Please briefly explain why patient needs home oxygen: To be able to able to have supplementary o2 to complete mobility and ADLs to be able to return home.   Warrick POUR OTR/L  Acute Rehab Services  [phone] office number

## 2024-03-02 NOTE — Discharge Summary (Signed)
 "  DISCHARGE SUMMARY  Stephanie Knight  MR#: [id number]  DOB:1960-12-01  Date of Admission: 02/22/2024 Date of Discharge: 03/02/2024  Attending Physician:Kyjuan Gause ONEIDA Moores, MD  Patient's ERE:[facility]  Disposition: D/C home   Follow-up Appts:  Contact information for follow-up providers     [facility] (DME) Follow up.   Specialty: DME Services Why: BSC, rolling walker Contact information: [address] [employer] [place]  [address] [phone]        [facility] Follow up in 7 day(s).   Contact information: [address] [place] [address] [phone]              Contact information for after-discharge care     Home Medical Care     [facility] .   Service: Home Health Services Why: Agency will call you to set up apt times Contact information: [address] [place]  [address] [phone]                    -Ambulatory referral made to [facility] for outpatient evaluation of OHS/SA w/ Cor Pulmonale and need for sleep study/device titration.   Tests Needing Follow-up: -assure patient is complaint with home oxygen at 5-6LPM at all times  -assure patient compliant with medications/diuretic  -continue to investigate ways to get this patient CPAP/BiPAP at home   Discharge Diagnoses: Acute on chronic cor pulmonale Acute hypoxic and hypercapnic respiratory failure due to above Acute delirium -metabolic encephalopathy due to hypercapnia COPD  OHS/SA Chronic diastolic/right-sided heart failure Morbid obesity - Body mass index is 43.89 kg/m.  Initial presentation: 64 year old with a history of severe COPD (recently prescribed home oxygen), tobacco abuse, and intermittent rectal bleeding who presented to [facility] 02/22/2024 with complaints of shortness of breath and severe bilateral lower extremity edema. She  was found to be in respiratory distress at presentation and required BiPAP for correction of hypercapnia. At admission she was found to have an elevated BNP at 1948 and she was polycythemic. CTa chest was negative for PE but did reveal significant right heart enlargement. TTE noted preserved EF but with right ventricular enlargement and septal flattening.   Hospital Course: 1/10 admit by TRH from [facility]  1/11 transfer to ICU for precedex  gtt for BiPAP compliance w/ severe agitation  1/11 TTE EF 60-65% -normal RV systolic function but ventricular septum flattened consistent with elevated RV pressure/volume overload -moderately increased RV size and wall thickness 1/16 TRH assumed care  1/19 D/C home with home O2 at 5-6L at all times - no option to arrange for home CPAP/BiPAP due to financial constraints   Acute on chronic cor pulmonale Due to unmanaged OHS/obesity as well as volume overload - continue lasix  dosing - net -19.5 L since admission   Acute hypoxic and hypercapnic respiratory failure due to above BiPAP dependent - would like to continue BiPAP (or at least CPAP) every night and during scheduled daytime naps but unfortunately there was no way to arrange this for home due to insurance/financial constraints - continues to require high level nasal cannula support both at rest and with ambulation - home O2 at 5-6LPM at all times has been arranged, but even that was quite challenging    Acute delirium -metabolic encephalopathy due to hypercapnia Required Precedex  while in ICU - transitioned to valproic  acid and Zyprexa  - stable and returned to baseline at time of d/c - discontinued Zyprexa  and valproic  acid  prior to d/c home    COPD No acute exacerbation encountered during this admission - continue usual scheduled medical therapies - was recently prescribed home O2 but does not yet have it set up at home - arrangements have been made by our inpatient care management team for her  to receive home O2 - cont triple therapy baseline inhaler with prn SABA inhaler    OHS Counseled on absolute need to continue to peruse acquisition of CPAP or BiPAP as outpt - ambulatory referral to [facility] made    Chronic diastolic/right-sided heart failure Continue diuresis as noted above   Morbid obesity - Body mass index is 43.89 kg/m.  Allergies as of 03/02/2024       Reactions   Aspirin  Nausea And Vomiting        Medication List     STOP taking these medications    Aleve 220 MG Caps Generic drug: Naproxen Sodium   ciprofloxacin 500 MG tablet Commonly known as: CIPRO   HYDROcodone -acetaminophen  5-325 MG tablet Commonly known as: NORCO/VICODIN   hydrOXYzine 25 MG tablet Commonly known as: ATARAX   meclizine  12.5 MG tablet Commonly known as: ANTIVERT    Trelegy Ellipta 100-62.5-25 MCG/ACT Aepb Generic drug: Fluticasone-Umeclidin-Vilant       TAKE these medications    acetaminophen  325 MG tablet Commonly known as: TYLENOL  Take 2 tablets (650 mg total) by mouth every 6 (six) hours as needed for mild pain (pain score 1-3), fever or headache (or Fever >/= 101).   albuterol  108 (90 Base) MCG/ACT inhaler Commonly known as: VENTOLIN  HFA Inhale 2 puffs into the lungs every 6 (six) hours as needed for wheezing or shortness of breath.   budesonide -glycopyrrolate -formoterol  160-9-4.8 MCG/ACT Aero inhaler Commonly known as: BREZTRI  Inhale 2 puffs into the lungs 2 (two) times daily. What changed: when to take this   carvedilol  3.125 MG tablet Commonly known as: COREG  Take 1 tablet (3.125 mg total) by mouth 2 (two) times daily with a meal. Start taking on: March 03, 2024   furosemide  40 MG tablet Commonly known as: LASIX  Take 1 tablet (40 mg total) by mouth daily.   polyethylene glycol 17 g packet Commonly known as: MIRALAX  / GLYCOLAX  Take 17 g by mouth 2 (two) times daily.   senna-docusate 8.6-50 MG tablet Commonly known as: Senokot-S Take 1  tablet by mouth 2 (two) times daily.   Zepbound 2.5 MG/0.5ML Pen Generic drug: tirzepatide Inject 2.5 mg into the skin once a week.               Durable Medical Equipment  (From admission, onward)           Start     Ordered   03/02/24 0751  For home use only DME oxygen  Once       Question Answer Comment  Length of Need Lifetime   Mode or (Route) Nasal cannula   Liters per Minute 6   Frequency Continuous (stationary and portable oxygen unit needed)   Oxygen conserving device Yes   Oxygen delivery system: Gas      03/02/24 0750   02/28/24 1238  For home use only DME Walker rolling  Once       Question Answer Comment  Walker: With 5 Inch Wheels   Patient needs a walker to treat with the following condition Weakness      02/28/24 1238   02/28/24 1237  For home use only DME Bedside commode  Once  Question:  Patient needs a bedside commode to treat with the following condition  Answer:  Weakness   02/28/24 1238            Day of Discharge BP (!) 106/55   Pulse 86   Temp 98.7 F (37.1 C) (Oral)   Resp 20   Ht 5' 7 (1.702 m)   Wt 127.1 kg   SpO2 (!) 83%   BMI 43.89 kg/m   Physical Exam: General: No acute respiratory distress Lungs: Faint bibasilar crackles with no wheezing - distant breath sounds due to body habitus Cardiovascular: Distant heart sounds due to body habitus - RRR without murmur Abdomen: Morbidly obese, soft, bowel sounds positive, no rebound Extremities: Chronic appearing 2+ bilateral lower extremity edema that is slowly improving  Basic Metabolic Panel: Recent Labs  Lab 02/25/24 0419 02/25/24 0902 02/25/24 2203 02/26/24 0933 02/27/24 0235 02/28/24 0605 03/02/24 0231  NA 146*   < > 140 145 142 143 141  K 4.5   < > 4.0 4.2 4.0 3.7 3.9  CL 97*  --   --  98 97* 101 96*  CO2 41*  --   --  39* 37* 37* 40*  GLUCOSE 122*  --   --  109* 77 105* 126*  BUN 24*  --   --  31* 27* 22 11  CREATININE 1.05*  --   --  1.06* 0.95 0.80  0.61  CALCIUM 9.0  --   --  8.8* 8.7* 8.5* 8.4*  MG 1.8  --   --  1.9 1.9  --   --   PHOS 5.0*  --   --   --   --   --   --    < > = values in this interval not displayed.    CBC: Recent Labs  Lab 02/25/24 0902 02/25/24 2203 02/28/24 0409  WBC  --   --  7.1  NEUTROABS  --   --  5.5  HGB 17.3* 17.0* 13.9  HCT 51.0* 50.0* 48.5*  MCV  --   --  83.3  PLT  --   --  227     Time spent in discharge (includes decision making & examination of pt): 35 minutes  03/02/2024, 4:54 PM   Reyes IVAR Moores, MD [facility] Office  [phone]      "

## 2024-03-02 NOTE — Progress Notes (Signed)
 Occupational Therapy Treatment Patient Details Name: Stephanie Knight MRN: [id number] DOB: March 24, 1960 Today's Date: 03/02/2024   History of present illness Pt is a 64 y.o female presented to [facility] ED 1/10 for BLE edema. Found to be hypoxic and transferred to [facility]. Workup for potential CHF. CT findings consistent with RV dysfunction. Elevated CO2 levels requiring BiPAP, however, becoming agitated with mask and poor tolerance started on dex for compliance. UTI. PMH: COPD, tobacco abuse   OT comments  Pt presented in bed and had o2 tubing under LLE and reading was at 82% on 5L via [place] and even with reposition and sitting at EOB was at 86% and placed on 6L with o2 then at 93%. Pt was educated through the session about line positioning as reported about this is all new to me. Pt completed sit to stand transfer to RW and started to urinate and then was cued to go to [facility] with CGA. Pt then completed urination and then ambulated room level with RW to chair with CGA. Pt in session was educated about IS and PLB. At this time recommendation for [facility] therapy and Acute Occupational Therapy to follow.      If plan is discharge home, recommend the following:  A little help with walking and/or transfers;A little help with bathing/dressing/bathroom;Assistance with cooking/housework;Assist for transportation;Help with stairs or ramp for entrance   Equipment Recommendations  Other (comment)    Recommendations for Other Services      Precautions / Restrictions Precautions Precautions: Fall Recall of Precautions/Restrictions: Intact Precaution/Restrictions Comments: watch BP & O2 Restrictions Weight Bearing Restrictions Per Provider Order: No       Mobility Bed Mobility Overal bed mobility: Modified Independent             General bed mobility comments: HOB elevated    Transfers Overall transfer level: Needs assistance Equipment used: Rolling walker (2 wheels) Transfers: Sit to/from  Stand Sit to Stand: Contact guard assist           General transfer comment: cues on hand position to decrease in pulling onto walker     Balance Overall balance assessment: Needs assistance Sitting-balance support: Feet supported Sitting balance-Leahy Scale: Good     Standing balance support: Bilateral upper extremity supported Standing balance-Leahy Scale: Fair                             ADL either performed or assessed with clinical judgement   ADL Overall ADL's : Needs assistance/impaired Eating/Feeding: Independent   Grooming: Set up;Sitting   Upper Body Bathing: Set up;Sitting   Lower Body Bathing: Minimal assistance;Sit to/from stand   Upper Body Dressing : Set up;Sitting   Lower Body Dressing: Minimal assistance   Toilet Transfer: Contact guard assist;Cueing for safety;Cueing for sequencing           Functional mobility during ADLs: Contact guard assist;Rolling walker (2 wheels);Cueing for sequencing;Cueing for safety      Extremity/Trunk Assessment Upper Extremity Assessment Upper Extremity Assessment: Generalized weakness   Lower Extremity Assessment Lower Extremity Assessment: Defer to PT evaluation        Vision   Vision Assessment?: No apparent visual deficits   Perception Perception Perception: Within Functional Limits   Praxis Praxis Praxis: WFL   Communication Communication Communication: No apparent difficulties   Cognition Arousal: Alert Behavior During Therapy: WFL for tasks assessed/performed Cognition: No apparent impairments  Following commands: Intact        Cueing   Cueing Techniques: Verbal cues  Exercises      Shoulder Instructions       General Comments      Pertinent Vitals/ Pain       Pain Assessment Pain Assessment: No/denies pain  Home Living                                          Prior Functioning/Environment               Frequency  Min 2X/week        Progress Toward Goals  OT Goals(current goals can now be found in the care plan section)  Progress towards OT goals: Progressing toward goals  Acute Rehab OT Goals Patient Stated Goal: to go home OT Goal Formulation: With patient Time For Goal Achievement: 03/16/24 Potential to Achieve Goals: Good ADL Goals Pt Will Perform Grooming: with supervision;standing Pt Will Perform Upper Body Dressing: with modified independence Pt Will Perform Lower Body Dressing: with supervision;sit to/from stand Pt Will Transfer to Toilet: with supervision;ambulating Additional ADL Goal #1: pt will tolerate at least 8 minutes of OOB functional activity to demonstrate improved endurance  Plan      Co-evaluation                 AM-PAC OT 6 Clicks Daily Activity     Outcome Measure   Help from another person eating meals?: None Help from another person taking care of personal grooming?: A Little Help from another person toileting, which includes using toliet, bedpan, or urinal?: A Lot Help from another person bathing (including washing, rinsing, drying)?: A Little Help from another person to put on and taking off regular upper body clothing?: A Little Help from another person to put on and taking off regular lower body clothing?: A Little 6 Click Score: 18    End of Session Equipment Utilized During Treatment: Gait belt;Rolling walker (2 wheels)  OT Visit Diagnosis: Unsteadiness on feet (R26.81);Other abnormalities of gait and mobility (R26.89);Muscle weakness (generalized) (M62.81)   Activity Tolerance Patient tolerated treatment well   Patient Left in chair;with call bell/phone within reach   Nurse Communication Mobility status;Other (comment) (o2)        Time: 1001-1031 OT Time Calculation (min): 30 min  Charges: OT General Charges $OT Visit: 1 Visit OT Treatments $Self Care/Home Management : 23-37 mins  Warrick POUR OTR/L  Acute Rehab  Services  [phone] office number   Warrick Berber 03/02/2024, 10:40 AM

## 2024-03-02 NOTE — Plan of Care (Signed)

## 2024-03-02 NOTE — Progress Notes (Incomplete)
 Nutrition Follow-up  DOCUMENTATION CODES:   Obesity unspecified  INTERVENTION:  ***   NUTRITION DIAGNOSIS:   Inadequate oral intake related to acute illness as evidenced by NPO status.  ***  GOAL:   Patient will meet greater than or equal to 90% of their needs  ***  MONITOR:   Labs, Weight trends, Diet advancement, I & O's  REASON FOR ASSESSMENT:   Consult Assessment of nutrition requirement/status  ASSESSMENT:   Pt admitted with c/o edema and respiratory distress r/t acute on chronic COPD. PMH significant for COPD, tobacco use, rectal bleeding.  ***   Admit weight: 141 Current weight: 127.1   WT trends decrease, diuretics   Average Meal Intake: 1/16-1/18: 83% intake x 6 recorded meals  Nutritionally Relevant Medications: Scheduled Meds:  furosemide   40 mg Intravenous Daily   polyethylene glycol  17 g Oral BID   senna-docusate  1 tablet Oral BID   Labs Reviewed: Cl: 96 CO2: 40 Ca: 8.4   NUTRITION - FOCUSED PHYSICAL EXAM:  {RD Focused Exam List:21252}  Diet Order:   Diet Order             Diet regular Room service appropriate? Yes; Fluid consistency: Thin  Diet effective now                   EDUCATION NEEDS:   Education needs have been addressed  Skin:  Skin Assessment: Reviewed RN Assessment  Last BM:  1/18  Height:   Ht Readings from Last 1 Encounters:  03/02/24 5' 7 (1.702 m)    Weight:   Wt Readings from Last 1 Encounters:  03/02/24 127.1 kg    Ideal Body Weight:  61.4 kg  BMI:  Body mass index is 43.89 kg/m.  Estimated Nutritional Needs:   Kcal:  1600-1800  Protein:  95-110g  Fluid:  >/=1.6L    ***

## 2024-03-02 NOTE — Evaluation (Signed)
 RT Evaluate and Treat Note  03/02/2024   Breathing is (select one): Same as normal    The following was found on auscultation (select multiple):  Bilateral Breath Sounds: Clear;Diminished (03/01/24 2153)  R Upper  Breath Sounds: Expiratory wheezes;Diminished (02/29/24 0754) L Upper Breath Sounds: Diminished (02/29/24 0754) R Lower Breath Sounds: Diminished (02/29/24 0754) L Lower Breath Sounds: Diminished (02/29/24 0754)    Cough Assessment: Cough: Productive (03/01/24 0100)    Most Recent Chest Xray:... (No results found.    The following medications and/or interventions were ordered/changed/discontinued as part of the Respiratory Treatment protocol:   Medication Changes: No changes made.  Breztri  BID and PRN Duoneb available.    Airway Clearance Changes: No changes made. Oral suction available.   Oxygen Therapy Changes: No changes made, pt remains on 5L nasal cannula.

## 2024-03-11 ENCOUNTER — Encounter (HOSPITAL_COMMUNITY): Payer: Self-pay

## 2024-03-11 ENCOUNTER — Emergency Department (HOSPITAL_COMMUNITY)
Admission: EM | Admit: 2024-03-11 | Discharge: 2024-03-11 | Disposition: A | Discharge status: Home / Self Care | Payer: Self-pay

## 2024-03-11 ENCOUNTER — Emergency Department (HOSPITAL_COMMUNITY): Payer: Self-pay

## 2024-03-11 ENCOUNTER — Emergency Department (EMERGENCY_DEPARTMENT_HOSPITAL): Admit: 2024-03-11 | Discharge: 2024-03-11 | Disposition: A | Payer: Self-pay

## 2024-03-11 ENCOUNTER — Other Ambulatory Visit: Payer: Self-pay

## 2024-03-11 DIAGNOSIS — M79661 Pain in right lower leg: Secondary | ICD-10-CM

## 2024-03-11 DIAGNOSIS — R2231 Localized swelling, mass and lump, right upper limb: Secondary | ICD-10-CM | POA: Insufficient documentation

## 2024-03-11 DIAGNOSIS — Z720 Tobacco use: Secondary | ICD-10-CM | POA: Insufficient documentation

## 2024-03-11 DIAGNOSIS — M7989 Other specified soft tissue disorders: Secondary | ICD-10-CM

## 2024-03-11 DIAGNOSIS — J449 Chronic obstructive pulmonary disease, unspecified: Secondary | ICD-10-CM | POA: Insufficient documentation

## 2024-03-11 LAB — COMPREHENSIVE METABOLIC PANEL WITH GFR
ALT: 39 U/L (ref 0–44)
AST: 29 U/L (ref 15–41)
Albumin: 3.4 g/dL — ABNORMAL LOW (ref 3.5–5.0)
Alkaline Phosphatase: 85 U/L (ref 38–126)
Anion gap: 5 (ref 5–15)
BUN: 13 mg/dL (ref 8–23)
CO2: 41 mmol/L — ABNORMAL HIGH (ref 22–32)
Calcium: 9 mg/dL (ref 8.9–10.3)
Chloride: 99 mmol/L (ref 98–111)
Creatinine, Ser: 0.8 mg/dL (ref 0.44–1.00)
GFR, Estimated: >60 mL/min
Glucose, Bld: 94 mg/dL (ref 70–99)
Potassium: 4 mmol/L (ref 3.5–5.1)
Sodium: 145 mmol/L (ref 135–145)
Total Bilirubin: 0.8 mg/dL (ref 0.0–1.2)
Total Protein: 5.8 g/dL — ABNORMAL LOW (ref 6.5–8.1)

## 2024-03-11 LAB — CBC WITH DIFFERENTIAL/PLATELET
Abs Immature Granulocytes: 0.09 10*3/uL — ABNORMAL HIGH (ref 0.00–0.07)
Basophils Absolute: 0 10*3/uL (ref 0.0–0.1)
Basophils Relative: 0 %
Eosinophils Absolute: 0.1 10*3/uL (ref 0.0–0.5)
Eosinophils Relative: 2 %
HCT: 44.7 % (ref 36.0–46.0)
Hemoglobin: 12.7 g/dL (ref 12.0–15.0)
Immature Granulocytes: 1 %
Lymphocytes Relative: 19 %
Lymphs Abs: 1.4 10*3/uL (ref 0.7–4.0)
MCH: 24.7 pg — ABNORMAL LOW (ref 26.0–34.0)
MCHC: 28.4 g/dL — ABNORMAL LOW (ref 30.0–36.0)
MCV: 87 fL (ref 80.0–100.0)
Monocytes Absolute: 0.8 10*3/uL (ref 0.1–1.0)
Monocytes Relative: 11 %
Neutro Abs: 4.8 10*3/uL (ref 1.7–7.7)
Neutrophils Relative %: 67 %
Platelets: 393 10*3/uL (ref 150–400)
RBC: 5.14 MIL/uL — ABNORMAL HIGH (ref 3.87–5.11)
RDW: 23.7 % — ABNORMAL HIGH (ref 11.5–15.5)
Smear Review: NORMAL
WBC: 7.3 10*3/uL (ref 4.0–10.5)
nRBC: 0.3 % — ABNORMAL HIGH (ref 0.0–0.2)

## 2024-03-11 LAB — PRO BRAIN NATRIURETIC PEPTIDE: Pro Brain Natriuretic Peptide: 1570 pg/mL — ABNORMAL HIGH

## 2024-03-11 MED ORDER — FUROSEMIDE 10 MG/ML IJ SOLN
40.0000 mg | Freq: Once | INTRAMUSCULAR | Status: AC
  Administered 2024-03-11: 40 mg via INTRAVENOUS
  Filled 2024-03-11: qty 4

## 2024-03-11 MED ORDER — FUROSEMIDE 8 MG/ML PO SOLN: 40.0000 mg | Freq: Once | ORAL | Status: DC

## 2024-03-11 NOTE — ED Provider Notes (Signed)
 " [place] EMERGENCY DEPARTMENT AT [facility] Provider Note   CSN: [id number] Arrival date & time: 03/11/24  9152     Patient presents with: Leg Swelling   Stephanie Knight is a 64 y.o. female.   64 y.o female with a PMH of COPD, Tobacco abuse, Anemia presents to the ED with a chief complaint of right leg swelling x some time. Patient reports he was recently admitted for acute exacerbation of heart failure, has continued to develop right leg swelling.  She reports taking her diuresis at home without any improvement in symptoms.  Patient does wear oxygen 5 L at baseline however has not had any increase in her O2 sats either.  She continues to endorse pain along with worsening swelling to the right leg despite diuresis at home.  No prior history of blood clots, not on any blood thinners at this time, no chest pain or shortness of breath.  The history is provided by the patient.       Prior to Admission medications  Medication Sig Start Date End Date Taking? Authorizing Provider  acetaminophen  (TYLENOL ) 325 MG tablet Take 2 tablets (650 mg total) by mouth every 6 (six) hours as needed for mild pain (pain score 1-3), fever or headache (or Fever >/= 101). 03/02/24   Danton Reyes DASEN, MD  albuterol  (VENTOLIN  HFA) 108 (90 Base) MCG/ACT inhaler Inhale 2 puffs into the lungs every 6 (six) hours as needed for wheezing or shortness of breath. 03/02/24   Danton Reyes DASEN, MD  budesonide -glycopyrrolate -formoterol  (BREZTRI ) 160-9-4.8 MCG/ACT AERO inhaler Inhale 2 puffs into the lungs 2 (two) times daily. 03/02/24   Danton Reyes DASEN, MD  carvedilol  (COREG ) 3.125 MG tablet Take 1 tablet (3.125 mg total) by mouth 2 (two) times daily with a meal. 03/03/24   Danton Reyes DASEN, MD  furosemide  (LASIX ) 40 MG tablet Take 1 tablet (40 mg total) by mouth daily. 03/02/24   Danton Reyes DASEN, MD  polyethylene glycol powder (GLYCOLAX /MIRALAX ) 17 GM/SCOOP powder Take 17 g by mouth 2 (two) times  daily. Dissolve 1 capful (17g) in 4-8 ounces of liquid and take by mouth daily. 03/02/24   Danton Reyes DASEN, MD  senna-docusate (SENOKOT-S) 8.6-50 MG tablet Take 1 tablet by mouth 2 (two) times daily. 03/02/24   Danton Reyes DASEN, MD  tirzepatide (ZEPBOUND) 2.5 MG/0.5ML Pen Inject 2.5 mg into the skin once a week. Patient not taking: Reported on 02/23/2024 07/16/23   [provider]    Allergies: Aspirin     Review of Systems  Constitutional:  Negative for chills and fever.  HENT:  Negative for sore throat.   Respiratory:  Negative for shortness of breath.   Cardiovascular:  Positive for leg swelling. Negative for chest pain.  Gastrointestinal:  Negative for abdominal pain, nausea and vomiting.  All other systems reviewed and are negative.   Updated Vital Signs BP 134/66   Pulse 79   Temp 98.1 F (36.7 C) (Oral)   Resp (!) 25   SpO2 98%   Physical Exam Vitals and nursing note reviewed.  Constitutional:      Appearance: She is obese.  HENT:     Head: Normocephalic and atraumatic.     Mouth/Throat:     Mouth: Mucous membranes are moist.  Cardiovascular:     Rate and Rhythm: Normal rate.  Pulmonary:     Effort: Pulmonary effort is normal.     Breath sounds: No wheezing or rales.  Abdominal:  General: Abdomen is flat.  Musculoskeletal:     Cervical back: Normal range of motion and neck supple.     Right lower leg: 2+ Edema present.     Left lower leg: 1+ Edema present.  Skin:    General: Skin is warm and dry.  Neurological:     Mental Status: She is alert and oriented to person, place, and time.     (all labs ordered are listed, but only abnormal results are displayed) Labs Reviewed  CBC WITH DIFFERENTIAL/PLATELET - Abnormal; Notable for the following components:      Result Value   RBC 5.14 (*)    MCH 24.7 (*)    MCHC 28.4 (*)    RDW 23.7 (*)    nRBC 0.3 (*)    Abs Immature Granulocytes 0.09 (*)    All other components within normal limits   COMPREHENSIVE METABOLIC PANEL WITH GFR - Abnormal; Notable for the following components:   CO2 41 (*)    Total Protein 5.8 (*)    Albumin  3.4 (*)    All other components within normal limits  PRO BRAIN NATRIURETIC PEPTIDE - Abnormal; Notable for the following components:   Pro Brain Natriuretic Peptide 1,570.0 (*)    All other components within normal limits    EKG: None  Radiology: VAS US  LOWER EXTREMITY VENOUS (DVT) (7a-5p) Result Date: 03/11/2024  Lower Venous DVT Study Patient Name:  Stephanie Knight [facility]  Date of Exam:   03/11/2024 Medical Rec #: [id number]          Accession #:    [id number] Date of Birth: 1960-10-09           Patient Gender: F Patient Age:   44 years Exam Location:  [facility] Procedure:      VAS US  LOWER EXTREMITY VENOUS (DVT) Referring Phys: MERL Mellonie Guess --------------------------------------------------------------------------------  Indications: Pain.  Risk Factors: None identified. Limitations: Body habitus and poor ultrasound/tissue interface. Comparison Study: No prior studies. Performing Technologist: Cordella Collet RVT  Examination Guidelines: A complete evaluation includes B-mode imaging, spectral Doppler, color Doppler, and power Doppler as needed of all accessible portions of each vessel. Bilateral testing is considered an integral part of a complete examination. Limited examinations for reoccurring indications may be performed as noted. The reflux portion of the exam is performed with the patient in reverse Trendelenburg.  +---------+---------------+---------+-----------+----------+--------------+ RIGHT    CompressibilityPhasicitySpontaneityPropertiesThrombus Aging +---------+---------------+---------+-----------+----------+--------------+ CFV      Full           Yes      Yes                                 +---------+---------------+---------+-----------+----------+--------------+ SFJ      Full                                                         +---------+---------------+---------+-----------+----------+--------------+ FV Prox  Full                                                        +---------+---------------+---------+-----------+----------+--------------+ FV Mid   Full                                                        +---------+---------------+---------+-----------+----------+--------------+  FV DistalFull                                                        +---------+---------------+---------+-----------+----------+--------------+ PFV      Full                                                        +---------+---------------+---------+-----------+----------+--------------+ POP      Full           Yes      Yes                                 +---------+---------------+---------+-----------+----------+--------------+ PTV      Full                                                        +---------+---------------+---------+-----------+----------+--------------+ PERO     Full                                                        +---------+---------------+---------+-----------+----------+--------------+   +----+---------------+---------+-----------+----------+--------------+ LEFTCompressibilityPhasicitySpontaneityPropertiesThrombus Aging +----+---------------+---------+-----------+----------+--------------+ CFV Full           Yes      Yes                                 +----+---------------+---------+-----------+----------+--------------+    Summary: RIGHT: - There is no evidence of deep vein thrombosis in the lower extremity.  - No cystic structure found in the popliteal fossa.  LEFT: - No evidence of common femoral vein obstruction.   *See table(s) above for measurements and observations.    Preliminary    DG Chest Portable 1 View Result Date: 03/11/2024 CLINICAL DATA:  Short of breath EXAM: PORTABLE CHEST 1 VIEW COMPARISON:  Prior chest x-ray 02/25/2024 FINDINGS:  Stable cardiomegaly. Pulmonary vascular congestion bordering on mild interstitial edema. No focal airspace infiltrate. No large pleural effusion or pneumothorax. No acute osseous abnormality. IMPRESSION: Cardiomegaly with pulmonary vascular congestion and trace interstitial edema. Findings are consistent with mild CHF, similar to slightly improved compared to February 25, 2024. Electronically Signed   By: Wilkie Lent M.D.   On: 03/11/2024 10:57     Procedures   Medications Ordered in the ED  furosemide  (LASIX ) injection 40 mg (40 mg Intravenous Given 03/11/24 1134)                                    Medical Decision Making Amount and/or Complexity of Data Reviewed Labs: ordered. Radiology: ordered.  Risk Prescription drug management.  This patient presents to the ED for concern of leg swelling, this involves a number of treatment  options, and is a complaint that carries with it a high risk of complications and morbidity.  The differential diagnosis includes DVT, heart failure, infection.    Co morbidities: Discussed in HPI   Brief History:  SEE HPI.   EMR reviewed including pt PMHx, past surgical history and past visits to ER.   See HPI for more details   Lab Tests:  I ordered and independently interpreted labs.  The pertinent results include:    I personally reviewed all laboratory work and imaging. Metabolic panel without any acute abnormality specifically kidney function within normal limits and no significant electrolyte abnormalities. CBC without leukocytosis or significant anemia.  proBNP is elevated at 1,570 improved than last time.   Imaging Studies:  DVT study is negative. Chest x-ray showed: IMPRESSION:  Cardiomegaly with pulmonary vascular congestion and trace  interstitial edema. Findings are consistent with mild CHF, similar  to slightly improved compared to February 25, 2024.    Cardiac Monitoring:  The patient was maintained on a cardiac  monitor.  I personally viewed and interpreted the cardiac monitored which showed an underlying rhythm of: NSR 79 EKG non-ischemic   Medicines ordered:  I ordered medication including lasix   for diuresis  Reevaluation of the patient after these medicines showed that the patient improved I have reviewed the patients home medicines and have made adjustments as needed  Reevaluation:  After the interventions noted above I re-evaluated patient and found that they have :improved  Social Determinants of Health:  The patient's social determinants of health were a factor in the care of this patient  Problem List / ED Course:  Patient presented to the ED with a few days of right leg swelling that is been ongoing.  Underlying history of CHF, reports taking her 40 mg Lasix  at home without much improvement in symptoms.  She does wear oxygen between 4 L and 6 L at baseline.  She arrived to the ED with a 5 L nasal cannula maintaining her O2 saturations at 96%.  On evaluation she does appear somewhat volume overloaded.  There is some concern for CHF exacerbation.  She tells me that she has been keeping track of her weight however does feel like there is more fluid in her.  She does also tell me that her pill at home versus the pill at the hospital works less.  She feels that when she is home she is unable to pull as much fluid. CMP with no electrolyte derangement, creatinine is within normal limits.  LFTs are unremarkable.  CBC with no leukocytosis, hemoglobin is within normal limits.  proBNP is elevated at 1500, however it is improved since her last visit a couple of weeks where it was closer to 2000. According to records which I have reviewed she is not on any blood thinners, and there is significant swelling to the right lower leg.  She is neurovascularly intact, but there is some concern for a DVT.  Ultrasound was ordered which was thankfully negative at this time. Ultrasound does show some pulmonary edema  and congestion, I do feel that patient would benefit from IV diuresis therefore Lasix  40 mg has been ordered. Patient will double up her dose of Lasix  over the next couple of days.  She tells me that she is previously increases to twice a day for 10 days.  Will have her follow-up with PCP.  She is hemodynamically stable for discharge.   Dispostion:  After consideration of the diagnostic results and the patients  response to treatment, I feel that the patent would benefit from follow-up with PCP and increase in Lasix .    Portions of this note were generated with Scientist, clinical (histocompatibility and immunogenetics). Dictation errors may occur despite best attempts at proofreading.   Final diagnoses:  Leg swelling    ED Discharge Orders     None          Maureen Broad, PA-C 03/11/24 1216    Simon Lavonia SAILOR, MD 03/11/24 1620  "

## 2024-03-11 NOTE — ED Notes (Signed)
 US  at bedside.

## 2024-03-11 NOTE — ED Notes (Signed)
Pt ambulated to BR with steady gait. Tolerated well.  

## 2024-03-11 NOTE — Progress Notes (Signed)
 Right lower extremity venous duplex has been completed. Preliminary results can be found in CV Proc through chart review.  Results were given to Johana Soto PA.  03/11/24 10:32 AM Cathlyn Collet RVT

## 2024-03-11 NOTE — Discharge Instructions (Addendum)
 Your laboratory results were within normal limits today, please schedule an appointment with your primary care physician for recheck of your electrolytes.  Please double your Lasix  medication for the next week.

## 2024-03-11 NOTE — ED Notes (Signed)
AVS provided to and discussed with patient and family member at bedside. Pt verbalizes understanding of discharge instructions and denies any questions or concerns at this time. Pt has ride home. Pt taken out of department via W/C.  ? ?

## 2024-03-11 NOTE — ED Notes (Signed)
 Lab called to collect labs sent down prior to orders being placed.

## 2024-03-11 NOTE — ED Triage Notes (Signed)
 Pt presents to ED from home C/O bilateral leg swelling. States she was recently admitted for same, continues to have swelling despite compliance with medications.   Pt also dyspneic, SaO2 82% on 3L on initial assessment, increased to 4L with improvement in SaO2 to 92%. Reports hx COPD. Pt reports 5-6L [place] at home.

## 2024-03-12 ENCOUNTER — Ambulatory Visit: Payer: Self-pay

## 2024-03-14 ENCOUNTER — Encounter (HOSPITAL_COMMUNITY): Payer: Self-pay

## 2024-03-14 ENCOUNTER — Emergency Department (HOSPITAL_COMMUNITY): Payer: Self-pay

## 2024-03-14 ENCOUNTER — Other Ambulatory Visit: Payer: Self-pay

## 2024-03-14 ENCOUNTER — Emergency Department (HOSPITAL_COMMUNITY)
Admission: EM | Admit: 2024-03-14 | Discharge: 2024-03-14 | Disposition: A | Discharge status: Home / Self Care | Payer: Self-pay | Attending: Emergency Medicine | Admitting: Emergency Medicine

## 2024-03-14 DIAGNOSIS — R0602 Shortness of breath: Secondary | ICD-10-CM | POA: Insufficient documentation

## 2024-03-14 DIAGNOSIS — M7989 Other specified soft tissue disorders: Secondary | ICD-10-CM | POA: Insufficient documentation

## 2024-03-14 DIAGNOSIS — J449 Chronic obstructive pulmonary disease, unspecified: Secondary | ICD-10-CM | POA: Insufficient documentation

## 2024-03-14 DIAGNOSIS — F17210 Nicotine dependence, cigarettes, uncomplicated: Secondary | ICD-10-CM | POA: Insufficient documentation

## 2024-03-14 DIAGNOSIS — I509 Heart failure, unspecified: Secondary | ICD-10-CM | POA: Insufficient documentation

## 2024-03-14 LAB — CBC WITH DIFFERENTIAL/PLATELET
Abs Immature Granulocytes: 0.03 10*3/uL (ref 0.00–0.07)
Basophils Absolute: 0 10*3/uL (ref 0.0–0.1)
Basophils Relative: 1 %
Eosinophils Absolute: 0.1 10*3/uL (ref 0.0–0.5)
Eosinophils Relative: 2 %
HCT: 47.4 % — ABNORMAL HIGH (ref 36.0–46.0)
Hemoglobin: 13.7 g/dL (ref 12.0–15.0)
Immature Granulocytes: 1 %
Lymphocytes Relative: 18 %
Lymphs Abs: 1.1 10*3/uL (ref 0.7–4.0)
MCH: 24.8 pg — ABNORMAL LOW (ref 26.0–34.0)
MCHC: 28.9 g/dL — ABNORMAL LOW (ref 30.0–36.0)
MCV: 85.9 fL (ref 80.0–100.0)
Monocytes Absolute: 0.6 10*3/uL (ref 0.1–1.0)
Monocytes Relative: 10 %
Neutro Abs: 4.5 10*3/uL (ref 1.7–7.7)
Neutrophils Relative %: 68 %
Platelets: 334 10*3/uL (ref 150–400)
RBC: 5.52 MIL/uL — ABNORMAL HIGH (ref 3.87–5.11)
RDW: 23.9 % — ABNORMAL HIGH (ref 11.5–15.5)
Smear Review: NORMAL
WBC: 6.4 10*3/uL (ref 4.0–10.5)
nRBC: 0 % (ref 0.0–0.2)

## 2024-03-14 LAB — COMPREHENSIVE METABOLIC PANEL WITH GFR
ALT: 29 U/L (ref 0–44)
AST: 27 U/L (ref 15–41)
Albumin: 3.5 g/dL (ref 3.5–5.0)
Alkaline Phosphatase: 82 U/L (ref 38–126)
Anion gap: 6 (ref 5–15)
BUN: 9 mg/dL (ref 8–23)
CO2: 40 mmol/L — ABNORMAL HIGH (ref 22–32)
Calcium: 9.2 mg/dL (ref 8.9–10.3)
Chloride: 97 mmol/L — ABNORMAL LOW (ref 98–111)
Creatinine, Ser: 0.89 mg/dL (ref 0.44–1.00)
GFR, Estimated: >60 mL/min
Glucose, Bld: 85 mg/dL (ref 70–99)
Potassium: 4.1 mmol/L (ref 3.5–5.1)
Sodium: 144 mmol/L (ref 135–145)
Total Bilirubin: 1.3 mg/dL — ABNORMAL HIGH (ref 0.0–1.2)
Total Protein: 6.3 g/dL — ABNORMAL LOW (ref 6.5–8.1)

## 2024-03-14 LAB — TROPONIN T, HIGH SENSITIVITY
Troponin T High Sensitivity: 40 ng/L — ABNORMAL HIGH (ref 0–19)
Troponin T High Sensitivity: 41 ng/L — ABNORMAL HIGH (ref 0–19)

## 2024-03-14 LAB — PRO BRAIN NATRIURETIC PEPTIDE: Pro Brain Natriuretic Peptide: 1235 pg/mL — ABNORMAL HIGH

## 2024-03-14 MED ORDER — FUROSEMIDE 10 MG/ML IJ SOLN
80.0000 mg | Freq: Once | INTRAMUSCULAR | Status: AC
  Administered 2024-03-14: 80 mg via INTRAVENOUS
  Filled 2024-03-14: qty 8

## 2024-03-14 NOTE — ED Notes (Signed)
 Called [facility] FOR PT PICK UP.

## 2024-03-14 NOTE — ED Provider Notes (Signed)
 " [place] EMERGENCY DEPARTMENT AT [facility] Provider Note   CSN: [id number] Arrival date & time: 03/14/24  1029     Patient presents with: Leg Swelling   Stephanie Knight is a 64 y.o. female.  Patient is a 64 year old female with a history of COPD and CHF on 4 to 6 L nasal cannula at baseline who presents to the ED via EMS for increasing leg pain/swelling and shortness of breath for the past week.  Notes that pain in the legs has gotten worse over the past 2 days prompting her return to the ED.  States she can barely walk now secondary to pain.  Notes she normally uses a walker at home.  She does live with her son.  She was seen in the ED 2 days ago and states she has been increasing her Lasix  dose, has been taking 80 mg daily but unfortunately symptoms have not gotten better.  She has not had to increase her oxygen.  Notes she still does smoke cigarettes.  Denies fevers, headache, dizziness, chest pain, abdominal pain, nausea/vomiting.   HPI     Prior to Admission medications  Medication Sig Start Date End Date Taking? Authorizing Provider  acetaminophen  (TYLENOL ) 325 MG tablet Take 2 tablets (650 mg total) by mouth every 6 (six) hours as needed for mild pain (pain score 1-3), fever or headache (or Fever >/= 101). 03/02/24   Danton Reyes DASEN, MD  albuterol  (VENTOLIN  HFA) 108 (90 Base) MCG/ACT inhaler Inhale 2 puffs into the lungs every 6 (six) hours as needed for wheezing or shortness of breath. 03/02/24   Danton Reyes DASEN, MD  budesonide -glycopyrrolate -formoterol  (BREZTRI ) 160-9-4.8 MCG/ACT AERO inhaler Inhale 2 puffs into the lungs 2 (two) times daily. 03/02/24   Danton Reyes DASEN, MD  carvedilol  (COREG ) 3.125 MG tablet Take 1 tablet (3.125 mg total) by mouth 2 (two) times daily with a meal. 03/03/24   Danton Reyes DASEN, MD  furosemide  (LASIX ) 40 MG tablet Take 1 tablet (40 mg total) by mouth daily. 03/02/24   Danton Reyes DASEN, MD  polyethylene glycol powder  (GLYCOLAX /MIRALAX ) 17 GM/SCOOP powder Take 17 g by mouth 2 (two) times daily. Dissolve 1 capful (17g) in 4-8 ounces of liquid and take by mouth daily. 03/02/24   Danton Reyes DASEN, MD  senna-docusate (SENOKOT-S) 8.6-50 MG tablet Take 1 tablet by mouth 2 (two) times daily. 03/02/24   Danton Reyes DASEN, MD  tirzepatide (ZEPBOUND) 2.5 MG/0.5ML Pen Inject 2.5 mg into the skin once a week. Patient not taking: Reported on 02/23/2024 07/16/23   [provider]    Allergies: Aspirin     Review of Systems  Constitutional:  Negative for fever.  Respiratory:  Positive for shortness of breath. Negative for cough.   Cardiovascular:  Positive for leg swelling. Negative for chest pain.  Neurological:  Negative for headaches.  All other systems reviewed and are negative.   Updated Vital Signs BP 122/62   Pulse 71   Temp 98.7 F (37.1 C) (Oral)   Resp (!) 25   SpO2 96%   Physical Exam Constitutional:      Appearance: Normal appearance.  HENT:     Head: Normocephalic and atraumatic.     Nose: Nose normal.     Mouth/Throat:     Mouth: Mucous membranes are moist.     Pharynx: Oropharynx is clear.  Cardiovascular:     Rate and Rhythm: Normal rate.     Pulses: Normal pulses.  Comments: PT pulses 2+ bilaterally Pulmonary:     Effort: Pulmonary effort is normal.     Breath sounds: Wheezing present.     Comments: Mild wheezing bilaterally.  No tachypnea or respiratory distress Musculoskeletal:     Right lower leg: Edema present.     Left lower leg: Edema present.     Comments: 2+ equal nonpitting edema to bilateral lower extremities.  No erythema or warmth.  Skin:    General: Skin is warm and dry.  Neurological:     Mental Status: She is alert.     (all labs ordered are listed, but only abnormal results are displayed) Labs Reviewed  COMPREHENSIVE METABOLIC PANEL WITH GFR - Abnormal; Notable for the following components:      Result Value   Chloride 97 (*)    CO2 40 (*)     Total Protein 6.3 (*)    Total Bilirubin 1.3 (*)    All other components within normal limits  CBC WITH DIFFERENTIAL/PLATELET - Abnormal; Notable for the following components:   RBC 5.52 (*)    HCT 47.4 (*)    MCH 24.8 (*)    MCHC 28.9 (*)    RDW 23.9 (*)    All other components within normal limits  PRO BRAIN NATRIURETIC PEPTIDE - Abnormal; Notable for the following components:   Pro Brain Natriuretic Peptide 1,235.0 (*)    All other components within normal limits  TROPONIN T, HIGH SENSITIVITY - Abnormal; Notable for the following components:   Troponin T High Sensitivity 40 (*)    All other components within normal limits  TROPONIN T, HIGH SENSITIVITY - Abnormal; Notable for the following components:   Troponin T High Sensitivity 41 (*)    All other components within normal limits    EKG: EKG Interpretation Date/Time:  Saturday March 14 2024 10:44:20 EST Ventricular Rate:  67 PR Interval:  160 QRS Duration:  95 QT Interval:  429 QTC Calculation: 453 R Axis:   253  Text Interpretation: Sinus rhythm Probable left atrial enlargement Markedly posterior QRS axis Low voltage, precordial leads Consider right ventricular hypertrophy Consider anterior infarct No significant change since last tracing Confirmed by Emil Share [phone]) on 03/14/2024 2:19:54 PM  Radiology: [facility] Chest Portable 1 View Result Date: 03/14/2024 CLINICAL DATA:  Worsening bilateral leg swelling. EXAM: PORTABLE CHEST 1 VIEW COMPARISON:  March 11, 2024 FINDINGS: The cardiac silhouette is mildly enlarged and unchanged in size. There is stable prominence of the pulmonary vasculature with mild, stable diffusely increased interstitial lung markings. No focal consolidation, pleural effusion or pneumothorax is identified. The visualized skeletal structures are unremarkable. IMPRESSION: Stable cardiomegaly and pulmonary vascular congestion with mild interstitial edema. Electronically Signed   By: Suzen Dials M.D.   On:  03/14/2024 11:56      Medications Ordered in the ED  furosemide  (LASIX ) injection 80 mg (80 mg Intravenous Given 03/14/24 1205)    Clinical Course as of 03/14/24 1457  Sat Mar 14, 2024  1053 Ultrasound right lower extremity for DVT on 1/28 negative  [AY]  1157 Pro Brain Natriuretic Peptide(!): 1,235.0 Improved from 1570 3 days prior [AY]    Clinical Course User Index [AY] Neysa Thersia RAMAN, PA-C                                Medical Decision Making Patient is a 64 year old female with a history of COPD and CHF on 4 to  6 L nasal cannula at baseline who presents to the ED via EMS for increasing leg pain/swelling and shortness of breath for the past week.  Please see detailed HPI above.  On exam patient is alert and in no acute distress.  Physical exam as noted above.  She is stable on her normal 5 L nasal cannula.  No acute respiratory distress noted.  She does have nonpitting edema noted to bilateral lower extremities but no overlying erythema.  Review of patient's chart does reveal she was seen at ED on 1/28 and had negative DVT imaging of the right lower extremity.  Less concerns for DVT versus PE at this time.  Suspect patient's pain and symptoms are secondary to continued volume overload and COPD.  EKG shows sinus rhythm.  Chest x-ray is stable compared to previous.  Stable cardiomegaly and vascular congestion.  BNP is 1200 that is improved from 3 days prior still.  Troponin stable today, less concerns for ACS.  She was given 80 mg Lasix  IV and states she is feeling better in her chest and legs.  Her workup today is reassuring.  This does appear to be chronic CHF without acute exacerbation.  She is mildly tachypneic when she moves but otherwise stable and resting in the bed.  We do not feel admission is warranted at this time as patient is improving with increased Lasix  at home for the past few days.  No acute respiratory distress and stable on her normal oxygen.  She plan to discharge home.   Will continue Lasix  80 mg daily for the next 3 to 4 days.  Advised to call PCP Monday to schedule follow-up outpatient.  She understands plan and is agreeable.  Return precautions provided.    Amount and/or Complexity of Data Reviewed Labs: ordered. Decision-making details documented in ED Course. Radiology: ordered.  Risk Prescription drug management.       Final diagnoses:  Leg swelling  Shortness of breath    ED Discharge Orders     None          Neysa Thersia RAMAN, PA-C 03/14/24 1457    Emil Share, DO 03/14/24 1517  "

## 2024-03-14 NOTE — ED Triage Notes (Signed)
 Pt BIB [facility] for bilateral leg swelling that has gotten worse last night. Pt has hx of CHF, COPD. On 5L [place] at home. Takes Lasix  PO daily. Pt sts meds at home have not been working. VSS.

## 2024-03-14 NOTE — Discharge Instructions (Addendum)
 Continue to take Lasix  80 mg daily for the next 3 days.  Call primary care doctor on Monday to schedule follow-up appointment this week.  Continue to wear oxygen.  Return to ED if any symptoms worsen including severe chest pain or difficulty breathing.
